# Patient Record
Sex: Male | Born: 1965 | Race: Black or African American | Hispanic: No | Marital: Married | Smoking: Never smoker
Health system: Southern US, Community
[De-identification: ages and names within clinical notes are randomized; demographics above are authoritative.]

## PROBLEM LIST (undated history)

## (undated) DIAGNOSIS — I10 Essential (primary) hypertension: Secondary | ICD-10-CM

## (undated) DIAGNOSIS — E785 Hyperlipidemia, unspecified: Secondary | ICD-10-CM

## (undated) HISTORY — PX: HAND SURGERY: SHX662

## (undated) HISTORY — PX: FOOT SURGERY: SHX648

## (undated) HISTORY — DX: Essential (primary) hypertension: I10

## (undated) HISTORY — DX: Hyperlipidemia, unspecified: E78.5

## (undated) HISTORY — PX: TRICEPS TENDON REPAIR: SHX2577

## (undated) HISTORY — PX: NOSE SURGERY: SHX723

---

## 1997-08-11 ENCOUNTER — Encounter: Admission: RE | Admit: 1997-08-11 | Discharge: 1997-08-11 | Payer: Self-pay | Admitting: *Deleted

## 2001-07-01 ENCOUNTER — Ambulatory Visit (HOSPITAL_COMMUNITY): Admission: RE | Admit: 2001-07-01 | Discharge: 2001-07-01 | Payer: Self-pay | Admitting: *Deleted

## 2001-08-07 ENCOUNTER — Encounter: Payer: Self-pay | Admitting: Emergency Medicine

## 2001-08-07 ENCOUNTER — Emergency Department (HOSPITAL_COMMUNITY): Admission: EM | Admit: 2001-08-07 | Discharge: 2001-08-07 | Payer: Self-pay | Admitting: Emergency Medicine

## 2003-11-30 ENCOUNTER — Ambulatory Visit (HOSPITAL_BASED_OUTPATIENT_CLINIC_OR_DEPARTMENT_OTHER): Admission: RE | Admit: 2003-11-30 | Discharge: 2003-11-30 | Payer: Self-pay | Admitting: General Surgery

## 2003-11-30 ENCOUNTER — Ambulatory Visit (HOSPITAL_COMMUNITY): Admission: RE | Admit: 2003-11-30 | Discharge: 2003-11-30 | Payer: Self-pay | Admitting: General Surgery

## 2010-05-08 ENCOUNTER — Other Ambulatory Visit: Payer: Self-pay | Admitting: Family Medicine

## 2010-05-08 DIAGNOSIS — Z0000 Encounter for general adult medical examination without abnormal findings: Secondary | ICD-10-CM

## 2010-05-09 ENCOUNTER — Ambulatory Visit
Admission: RE | Admit: 2010-05-09 | Discharge: 2010-05-09 | Disposition: A | Discharge status: Home / Self Care | Payer: 59 | Source: Ambulatory Visit | Attending: Family Medicine | Admitting: Family Medicine

## 2010-05-09 DIAGNOSIS — Z0000 Encounter for general adult medical examination without abnormal findings: Secondary | ICD-10-CM

## 2011-11-13 ENCOUNTER — Other Ambulatory Visit: Payer: Self-pay | Admitting: Family Medicine

## 2011-11-13 DIAGNOSIS — R109 Unspecified abdominal pain: Secondary | ICD-10-CM

## 2011-11-15 ENCOUNTER — Ambulatory Visit
Admission: RE | Admit: 2011-11-15 | Discharge: 2011-11-15 | Disposition: A | Discharge status: Home / Self Care | Payer: 59 | Source: Ambulatory Visit | Attending: Family Medicine | Admitting: Family Medicine

## 2011-11-15 DIAGNOSIS — R109 Unspecified abdominal pain: Secondary | ICD-10-CM

## 2011-11-15 MED ORDER — IOHEXOL 300 MG/ML  SOLN
125.0000 mL | Freq: Once | INTRAMUSCULAR | Status: AC | PRN
  Administered 2011-11-15: 125 mL via INTRAVENOUS

## 2011-12-10 ENCOUNTER — Encounter (HOSPITAL_COMMUNITY): Payer: Self-pay | Admitting: *Deleted

## 2011-12-10 ENCOUNTER — Emergency Department (HOSPITAL_COMMUNITY)
Admission: EM | Admit: 2011-12-10 | Discharge: 2011-12-10 | Disposition: A | Discharge status: Home / Self Care | Payer: 59 | Attending: Emergency Medicine | Admitting: Emergency Medicine

## 2011-12-10 DIAGNOSIS — Z2089 Contact with and (suspected) exposure to other communicable diseases: Secondary | ICD-10-CM | POA: Insufficient documentation

## 2011-12-10 NOTE — ED Provider Notes (Signed)
History   This chart was scribed for Gabriel Skene, MD by Gerlean Ren, ED Scribe. This patient was seen in room TR02C/TR02C and the patient's care was started at 11:23 PM    CSN: [id number]  Arrival date & time 12/10/11  2214   First MD Initiated Contact with Patient 12/10/11 2323      Chief Complaint  Patient presents with  . Rash     The history is provided by the patient. No language interpreter was used.   Gabriel Barnett is a 46 y.o. male who presents to the Emergency Department worried about possible exposure to scabies.  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  Pt does not c/o itching or rash but wants to be sure he did not contract scabies.  Pt denies dyspnea or chest pain.  Pt has no h/o chronic medical conditions.  Pt denies tobacco and alcohol use.   History reviewed. No pertinent past medical history.  No past surgical history on file.  No family history on file.  History  Substance Use Topics  . Smoking status: Not on file  . Smokeless tobacco: Not on file  . Alcohol Use: Not on file      Review of Systems At least 10pt or greater review of systems completed and are negative except where specified in the HPI.  Allergies  Review of patient's allergies indicates no known allergies.  Home Medications  No current outpatient prescriptions on file.  BP 118/79  Pulse 96  Temp 98.2 F (36.8 C) (Oral)  Resp 16  SpO2 95%  Physical Exam  Nursing notes reviewed.  Electronic medical record reviewed. VITAL SIGNS:   Filed Vitals:   12/10/11 2225  BP: 118/79  Pulse: 96  Temp: 98.2 F (36.8 C)  TempSrc: Oral  Resp: 16  SpO2: 95%   CONSTITUTIONAL: Awake, oriented, appears non-toxic, heavily muscled HENT: Atraumatic, normocephalic, oral mucosa pink and moist, airway patent. Nares patent without drainage. External ears normal. EYES: Conjunctiva clear, EOMI,  PERRLA NECK: Trachea midline, non-tender, supple CARDIOVASCULAR: Normal heart rate, Normal rhythm, No murmurs, rubs, gallops PULMONARY/CHEST: Clear to auscultation, no rhonchi, wheezes, or rales. Symmetrical breath sounds. Non-tender. ABDOMINAL: Non-distended, soft, non-tender - no rebound or guarding.  BS normal. NEUROLOGIC: Non-focal, moving all four extremities, no gross sensory or motor deficits. EXTREMITIES: No clubbing, cyanosis, or edema SKIN: Warm, Dry, No erythema, No rash - no burrows  ED Course  Procedures (including critical care time) DIAGNOSTIC STUDIES: Oxygen Saturation is 95% on room air, adequate by my interpretation.    COORDINATION OF CARE: 11:30 PM- Patient informed of clinical course, understands medical decision-making process, and agrees with plan.  Labs Reviewed - No data to display No results found.   1. Exposure to scabies       MDM  [name] is a 46 y.o. male with possible exposure to scabies.  No symptoms or physical findings.  Pt given Rx for 26mcg/kg ivermectin single dose in case symptoms do appear. Will f/u with PCP/Urgent care if medication is taken or symtpoms appear.   I personally performed the services described in this documentation, which was scribed in my presence. The recorded information has been reviewed and is accurate. Gabriel Barnett, M.D.          Gabriel Skene, MD 12/11/11 [phone]

## 2011-12-10 NOTE — ED Notes (Signed)
Pt reports outbreak of scabies at work.  Denies itching, wants to be sure he doesn't have it.

## 2011-12-11 MED ORDER — IVERMECTIN 3 MG PO TABS: 25.0000 mg | ORAL_TABLET | Freq: Once | ORAL | Status: DC

## 2011-12-13 ENCOUNTER — Other Ambulatory Visit (HOSPITAL_COMMUNITY): Payer: Self-pay | Admitting: Urology

## 2011-12-13 DIAGNOSIS — N2889 Other specified disorders of kidney and ureter: Secondary | ICD-10-CM

## 2011-12-18 ENCOUNTER — Other Ambulatory Visit (HOSPITAL_COMMUNITY): Payer: Self-pay | Admitting: Urology

## 2011-12-18 DIAGNOSIS — D49519 Neoplasm of unspecified behavior of unspecified kidney: Secondary | ICD-10-CM

## 2011-12-26 ENCOUNTER — Ambulatory Visit (HOSPITAL_COMMUNITY): Payer: 59

## 2011-12-27 ENCOUNTER — Ambulatory Visit (HOSPITAL_COMMUNITY)
Admission: RE | Admit: 2011-12-27 | Discharge: 2011-12-27 | Disposition: A | Discharge status: Home / Self Care | Payer: 59 | Source: Ambulatory Visit | Attending: Urology | Admitting: Urology

## 2011-12-27 DIAGNOSIS — N289 Disorder of kidney and ureter, unspecified: Secondary | ICD-10-CM | POA: Insufficient documentation

## 2011-12-27 DIAGNOSIS — R109 Unspecified abdominal pain: Secondary | ICD-10-CM | POA: Insufficient documentation

## 2011-12-27 DIAGNOSIS — D49519 Neoplasm of unspecified behavior of unspecified kidney: Secondary | ICD-10-CM

## 2011-12-27 DIAGNOSIS — N390 Urinary tract infection, site not specified: Secondary | ICD-10-CM | POA: Insufficient documentation

## 2011-12-27 DIAGNOSIS — N281 Cyst of kidney, acquired: Secondary | ICD-10-CM | POA: Insufficient documentation

## 2011-12-27 LAB — CREATININE, SERUM
Creatinine, Ser: 1.4 mg/dL — ABNORMAL HIGH (ref 0.50–1.35)
GFR calc Af Amer: 68 mL/min — ABNORMAL LOW (ref >90)
GFR calc non Af Amer: 58 mL/min — ABNORMAL LOW (ref >90)

## 2011-12-27 MED ORDER — GADOBENATE DIMEGLUMINE 529 MG/ML IV SOLN
20.0000 mL | Freq: Once | INTRAVENOUS | Status: AC | PRN
  Administered 2011-12-27: 20 mL via INTRAVENOUS

## 2016-01-16 DIAGNOSIS — R739 Hyperglycemia, unspecified: Secondary | ICD-10-CM | POA: Diagnosis not present

## 2016-01-16 DIAGNOSIS — M10042 Idiopathic gout, left hand: Secondary | ICD-10-CM | POA: Diagnosis not present

## 2016-01-16 DIAGNOSIS — I10 Essential (primary) hypertension: Secondary | ICD-10-CM | POA: Diagnosis not present

## 2016-01-16 DIAGNOSIS — E7800 Pure hypercholesterolemia, unspecified: Secondary | ICD-10-CM | POA: Diagnosis not present

## 2016-01-16 DIAGNOSIS — M7989 Other specified soft tissue disorders: Secondary | ICD-10-CM | POA: Diagnosis not present

## 2016-02-20 DIAGNOSIS — J209 Acute bronchitis, unspecified: Secondary | ICD-10-CM | POA: Diagnosis not present

## 2016-02-20 DIAGNOSIS — R5383 Other fatigue: Secondary | ICD-10-CM | POA: Diagnosis not present

## 2016-02-20 DIAGNOSIS — K210 Gastro-esophageal reflux disease with esophagitis: Secondary | ICD-10-CM | POA: Diagnosis not present

## 2016-05-01 DIAGNOSIS — I10 Essential (primary) hypertension: Secondary | ICD-10-CM | POA: Diagnosis not present

## 2016-05-01 DIAGNOSIS — E7800 Pure hypercholesterolemia, unspecified: Secondary | ICD-10-CM | POA: Diagnosis not present

## 2016-05-01 DIAGNOSIS — R739 Hyperglycemia, unspecified: Secondary | ICD-10-CM | POA: Diagnosis not present

## 2016-05-18 DIAGNOSIS — I10 Essential (primary) hypertension: Secondary | ICD-10-CM | POA: Diagnosis not present

## 2016-05-18 DIAGNOSIS — R7989 Other specified abnormal findings of blood chemistry: Secondary | ICD-10-CM | POA: Diagnosis not present

## 2016-05-18 DIAGNOSIS — K210 Gastro-esophageal reflux disease with esophagitis: Secondary | ICD-10-CM | POA: Diagnosis not present

## 2016-05-18 DIAGNOSIS — E7800 Pure hypercholesterolemia, unspecified: Secondary | ICD-10-CM | POA: Diagnosis not present

## 2016-05-18 DIAGNOSIS — R748 Abnormal levels of other serum enzymes: Secondary | ICD-10-CM | POA: Diagnosis not present

## 2016-06-07 DIAGNOSIS — K625 Hemorrhage of anus and rectum: Secondary | ICD-10-CM | POA: Diagnosis not present

## 2016-09-04 DIAGNOSIS — R7303 Prediabetes: Secondary | ICD-10-CM | POA: Diagnosis not present

## 2016-09-04 DIAGNOSIS — E7800 Pure hypercholesterolemia, unspecified: Secondary | ICD-10-CM | POA: Diagnosis not present

## 2016-09-04 DIAGNOSIS — I10 Essential (primary) hypertension: Secondary | ICD-10-CM | POA: Diagnosis not present

## 2016-09-17 DIAGNOSIS — R7303 Prediabetes: Secondary | ICD-10-CM | POA: Diagnosis not present

## 2016-09-17 DIAGNOSIS — R748 Abnormal levels of other serum enzymes: Secondary | ICD-10-CM | POA: Diagnosis not present

## 2016-09-17 DIAGNOSIS — R7989 Other specified abnormal findings of blood chemistry: Secondary | ICD-10-CM | POA: Diagnosis not present

## 2016-09-17 DIAGNOSIS — I10 Essential (primary) hypertension: Secondary | ICD-10-CM | POA: Diagnosis not present

## 2016-10-23 DIAGNOSIS — I129 Hypertensive chronic kidney disease with stage 1 through stage 4 chronic kidney disease, or unspecified chronic kidney disease: Secondary | ICD-10-CM | POA: Diagnosis not present

## 2016-10-23 DIAGNOSIS — R799 Abnormal finding of blood chemistry, unspecified: Secondary | ICD-10-CM | POA: Diagnosis not present

## 2017-01-03 DIAGNOSIS — R7303 Prediabetes: Secondary | ICD-10-CM | POA: Diagnosis not present

## 2017-01-03 DIAGNOSIS — R748 Abnormal levels of other serum enzymes: Secondary | ICD-10-CM | POA: Diagnosis not present

## 2017-01-03 DIAGNOSIS — I10 Essential (primary) hypertension: Secondary | ICD-10-CM | POA: Diagnosis not present

## 2017-01-22 DIAGNOSIS — I129 Hypertensive chronic kidney disease with stage 1 through stage 4 chronic kidney disease, or unspecified chronic kidney disease: Secondary | ICD-10-CM | POA: Diagnosis not present

## 2017-02-13 DIAGNOSIS — L700 Acne vulgaris: Secondary | ICD-10-CM | POA: Diagnosis not present

## 2017-06-24 DIAGNOSIS — R7303 Prediabetes: Secondary | ICD-10-CM | POA: Diagnosis not present

## 2017-06-24 DIAGNOSIS — I10 Essential (primary) hypertension: Secondary | ICD-10-CM | POA: Diagnosis not present

## 2017-06-24 DIAGNOSIS — R748 Abnormal levels of other serum enzymes: Secondary | ICD-10-CM | POA: Diagnosis not present

## 2017-06-28 DIAGNOSIS — R7303 Prediabetes: Secondary | ICD-10-CM | POA: Diagnosis not present

## 2017-06-28 DIAGNOSIS — R7989 Other specified abnormal findings of blood chemistry: Secondary | ICD-10-CM | POA: Diagnosis not present

## 2017-06-28 DIAGNOSIS — I10 Essential (primary) hypertension: Secondary | ICD-10-CM | POA: Diagnosis not present

## 2017-06-28 DIAGNOSIS — E7800 Pure hypercholesterolemia, unspecified: Secondary | ICD-10-CM | POA: Diagnosis not present

## 2017-08-28 DIAGNOSIS — S46312A Strain of muscle, fascia and tendon of triceps, left arm, initial encounter: Secondary | ICD-10-CM | POA: Diagnosis not present

## 2017-10-23 DIAGNOSIS — R799 Abnormal finding of blood chemistry, unspecified: Secondary | ICD-10-CM | POA: Diagnosis not present

## 2017-10-23 DIAGNOSIS — I129 Hypertensive chronic kidney disease with stage 1 through stage 4 chronic kidney disease, or unspecified chronic kidney disease: Secondary | ICD-10-CM | POA: Diagnosis not present

## 2017-10-23 DIAGNOSIS — Z7952 Long term (current) use of systemic steroids: Secondary | ICD-10-CM | POA: Diagnosis not present

## 2017-11-26 DIAGNOSIS — R7303 Prediabetes: Secondary | ICD-10-CM | POA: Diagnosis not present

## 2017-11-26 DIAGNOSIS — E7800 Pure hypercholesterolemia, unspecified: Secondary | ICD-10-CM | POA: Diagnosis not present

## 2017-11-26 DIAGNOSIS — R079 Chest pain, unspecified: Secondary | ICD-10-CM | POA: Diagnosis not present

## 2017-11-26 DIAGNOSIS — I10 Essential (primary) hypertension: Secondary | ICD-10-CM | POA: Diagnosis not present

## 2018-02-14 DIAGNOSIS — M26609 Unspecified temporomandibular joint disorder, unspecified side: Secondary | ICD-10-CM | POA: Diagnosis not present

## 2018-02-14 DIAGNOSIS — G5602 Carpal tunnel syndrome, left upper limb: Secondary | ICD-10-CM | POA: Diagnosis not present

## 2018-03-17 DIAGNOSIS — Z125 Encounter for screening for malignant neoplasm of prostate: Secondary | ICD-10-CM | POA: Diagnosis not present

## 2018-03-17 DIAGNOSIS — E7800 Pure hypercholesterolemia, unspecified: Secondary | ICD-10-CM | POA: Diagnosis not present

## 2018-03-17 DIAGNOSIS — R739 Hyperglycemia, unspecified: Secondary | ICD-10-CM | POA: Diagnosis not present

## 2018-03-17 DIAGNOSIS — I10 Essential (primary) hypertension: Secondary | ICD-10-CM | POA: Diagnosis not present

## 2018-03-17 DIAGNOSIS — R7989 Other specified abnormal findings of blood chemistry: Secondary | ICD-10-CM | POA: Diagnosis not present

## 2018-05-02 ENCOUNTER — Other Ambulatory Visit: Payer: Self-pay | Admitting: Family Medicine

## 2018-05-02 ENCOUNTER — Other Ambulatory Visit: Payer: Self-pay

## 2018-05-02 ENCOUNTER — Ambulatory Visit
Admission: RE | Admit: 2018-05-02 | Discharge: 2018-05-02 | Disposition: A | Discharge status: Home / Self Care | Payer: 59 | Source: Ambulatory Visit | Attending: Family Medicine | Admitting: Family Medicine

## 2018-05-02 DIAGNOSIS — M545 Low back pain: Secondary | ICD-10-CM | POA: Diagnosis not present

## 2018-05-02 DIAGNOSIS — M5416 Radiculopathy, lumbar region: Secondary | ICD-10-CM

## 2018-05-15 ENCOUNTER — Ambulatory Visit: Payer: 59 | Admitting: Dietician

## 2018-05-29 ENCOUNTER — Ambulatory Visit: Payer: 59 | Admitting: Registered"

## 2018-06-11 ENCOUNTER — Ambulatory Visit: Payer: 59 | Admitting: Dietician

## 2018-06-26 ENCOUNTER — Encounter: Payer: 59 | Attending: Family Medicine | Admitting: Dietician

## 2018-06-26 ENCOUNTER — Encounter: Payer: Self-pay | Admitting: Dietician

## 2018-06-26 ENCOUNTER — Other Ambulatory Visit: Payer: Self-pay

## 2018-06-26 DIAGNOSIS — R7303 Prediabetes: Secondary | ICD-10-CM | POA: Diagnosis not present

## 2018-06-26 NOTE — Progress Notes (Signed)
Medical Nutrition Therapy  Appt Start Time: 7:45am End Time: 9:00am  Primary concerns today: weight maintenance and proper amounts of food to eat based on age and activity level, certain foods/products/supplements that are appropriate, plant-based eating, cholesterol and blood sugar management   Referral diagnosis: Z78.9 Vegetarian Diet and R73.03 Prediabetes Preferred learning style: no preference indicated Learning readiness: ready   NUTRITION ASSESSMENT   Anthropometrics  Weight: 263.2lbs  Height: 73.5 in  BMI: 34.2  Body Composition Scale 06/26/2018  Total Body Fat % 30.2  Visceral Fat 21  Fat-Free Mass % 69.7   Total Body Water % 50.7   Muscle-Mass lbs 49.6  Body Fat Displacement          Torso  lbs 49.3         Left Leg  lbs 9.8         Right Leg  lbs 9.8         Left Arm  lbs 4.9         Right Arm   lbs 4.9   Basal Metabolic Rate: 2992 kcals  Min (Light Activity): 2502 kcals    Max (Very Active): 3962 kcals   Biochemical Data & Labs HgbA1c: 6.2% (H) Creatinine: 1.43 mg/dL (H)  Cholesterol: 203 mg/dL (H)  HDL: 46 mg/dL  TG: 92 mg/dL  LDL: 138 mg/dL (H)  Non-HDL: 156 mg/dL (H)   Clinical Medical Hx: hypercholesterolemia, HTN  Surgeries: nose, tricep, hand (x2), foot/ankle  Medications: none Allergies: NKA  Psychosocial/Lifestyle Pt is very talkative. 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 Has a wife, children, and grandchildren. Pt is very personable and friendly, was engaged during the appointment.   24-Hr Dietary Recall First Meal: egg whites (24g protein) + 1 to 2 packets grits (or protein pancakes: pancake mix + protein powder + banana + cinnamon + skim milk + water)  Snack: eggs + spinach (or [place]) + salsa Second Meal: spinach + chicken + rice  Snack: boiled eggs + tater tots (or hamburger patty + fries)  Third Meal: lower  calorie protein shake  Snack: egg whites + grits  Beverages: water, sparkling water   Food & Nutrition Related Hx Dietary Hx: Pt states he eats about every 3 hours. Sometimes will eat cafeteria food at the prison. Likes to make protein pancakes and have protein shakes. Likes sauces: ketchup, ranch, BBQ. Generally avoids pork (sausage, bacon) and red meat. States he tries to cut back on breads and rice or will have those things in between meals, but tries to focus on meat/protein and vegetables at mealtimes.  States his doctor would like him to follow a vegetarian diet, and that he is open to more plant-based options but not particularly interested in eating vegetarian diet. States he is "sensitive" to fiber so avoids large amounts of beans. Will have an energy drink every once in a while (every other week maybe.) Likes cinnamon and tries to add it to foods and takes cinnamon pills.  Estimated Daily Fluid Intake: 1+ gallon Supplements: L-arginine, protein, testosterone boosters, vitamin C, vitamin D, B12, milk thistle, saw palmetto, CoQ-10    GI / Other Notable Symptoms: none stated   Physical Activity  Current average weekly physical activity: 4-5 days/week of weight lifting, been adding in more cardio lately (~10 mins/gym session)   Estimated Energy Needs Calories: 3500 Carbohydrate: 395g Protein: 220g Fat:  115g   NUTRITION DIAGNOSIS  Altered nutrition-related laboratory values ([place]-2.2) related to prediabetes and high cholesterol as evidenced by elevated HgbA1c (6.2%), cholesterol (203mg /dL), and LDL cholesterol (138mg /dL.)   NUTRITION INTERVENTION  Nutrition education (E-1) on the following topics:  . Plant-based eating (including plant protein sources and products)  . General healthful eating  . Types of sugar  Education was limited partially because pt arrived late to appointment. Additionally, pt is quite talkative which made it difficult to provide education and guide the  appointment. Pt seems to understand very well basic nutrition and what is appropriate for his amount and type of physical activity, but asked very good questions about specific things. Hope to educate more in the future on specific dietary changes appropriate for cholesterol management.   Handouts Provided Include   Types of Sugar & Effects on BG   Plant Based  Learning Style & Readiness for Change Teaching method utilized: Visual & Auditory  Demonstrated degree of understanding via: Teach Back  Barriers to learning/adherence to lifestyle change: None Identified   Goals Established by Pt . Include more plant based foods and products in diet.  Marland Kitchen Avoid added sugars (<10 grams/serving.)    MONITORING & EVALUATION Dietary intake, weekly physical activity, and goals prn.  RD's Notes for Next Visit  . Cholesterol . Balanced Meal Ideas  Next Steps  Patient is to contact [facility] to schedule follow up visit as needed/desired or as recommended per MD.

## 2018-06-26 NOTE — Patient Instructions (Addendum)
   Start including more plant-based foods and products into your diet throughout the day.   Remember to spread your carbohydrate intake throughout the day. Best carbohydrate foods are whole foods (fruits, vegetables, and whole grains.) Always try to eat protein foods with carbohydrate foods.   Keep added sugars low (try to do 10 grams or less per serving). Stay away from syrup, sodas, desserts, and even large amounts of fruit juice.   Keep eating every 3-4 hours and drinking plenty of fluids, especially water.

## 2020-04-19 ENCOUNTER — Other Ambulatory Visit: Payer: Self-pay | Admitting: Family Medicine

## 2020-04-19 DIAGNOSIS — R222 Localized swelling, mass and lump, trunk: Secondary | ICD-10-CM

## 2020-05-04 ENCOUNTER — Ambulatory Visit
Admission: RE | Admit: 2020-05-04 | Discharge: 2020-05-04 | Disposition: A | Discharge status: Home / Self Care | Payer: 59 | Source: Ambulatory Visit | Attending: Family Medicine | Admitting: Family Medicine

## 2020-05-04 DIAGNOSIS — R222 Localized swelling, mass and lump, trunk: Secondary | ICD-10-CM

## 2020-05-07 ENCOUNTER — Other Ambulatory Visit: Payer: Self-pay

## 2020-05-07 ENCOUNTER — Ambulatory Visit (HOSPITAL_COMMUNITY)
Admission: EM | Admit: 2020-05-07 | Discharge: 2020-05-07 | Disposition: A | Discharge status: Home / Self Care | Payer: 59 | Attending: Medical Oncology | Admitting: Medical Oncology

## 2020-05-07 ENCOUNTER — Encounter (HOSPITAL_COMMUNITY): Payer: Self-pay | Admitting: *Deleted

## 2020-05-07 DIAGNOSIS — K219 Gastro-esophageal reflux disease without esophagitis: Secondary | ICD-10-CM | POA: Diagnosis not present

## 2020-05-07 MED ORDER — OMEPRAZOLE 20 MG PO CPDR: 20.0000 mg | DELAYED_RELEASE_CAPSULE | Freq: Every day | ORAL | 0 refills | Status: DC

## 2020-05-07 NOTE — ED Provider Notes (Signed)
[facility]    CSN: [id number] Arrival date & time: 05/07/20  1442      History   Chief Complaint Chief Complaint  Patient presents with  . Gastroesophageal Reflux    HPI Gabriel Barnett is a 55 y.o. male.   HPI   GERD: Pt reports a history of GERD that is recently worsened after he started drinking a beet juice. HE reports that when he eats he feels as if food will get stuck going down. No choking episodes. No vomiting, chest pain or SOB. He thinks it is possible that he has a hiatal hernia but is unsure. He has tried tums for symptoms which has helped some.     Past Medical History:  Diagnosis Date  . Hyperlipidemia   . Hypertension     There are no problems to display for this patient.   Past Surgical History:  Procedure Laterality Date  . FOOT SURGERY Right    foot and ankle  . HAND SURGERY Right   . NOSE SURGERY    . TRICEPS TENDON REPAIR         Home Medications    Prior to Admission medications   Medication Sig Start Date End Date Taking? Authorizing Provider  omeprazole (PRILOSEC) 20 MG capsule Take 1 capsule (20 mg total) by mouth daily. 05/07/20  Yes Anamarie Hunn M, PA-C  ivermectin (STROMECTOL) 3 MG TABS Take 8.5 tablets (25.5 mg total) by mouth once. ONLY take if you develop symptoms of scabies with itchy rash. YOU MUST have doctors follow up for another dose. 12/11/11   Bonk, Harrington Challenger, MD  PRESCRIPTION MEDICATION Take 1 tablet by mouth daily. hctz    [provider]    Family History Family History  Problem Relation Age of Onset  . Diabetes type II Other     Social History Social History   Tobacco Use  . Smoking status: Never Smoker  . Smokeless tobacco: Never Used     Allergies   Patient has no known allergies.   Review of Systems Review of Systems  As stated above in HPI Physical Exam Triage Vital Signs ED Triage Vitals [05/07/20 1515]  Enc Vitals Group     BP 127/78     Pulse Rate 84     Resp 18      Temp 98.4 F (36.9 C)     Temp Source Oral     SpO2 94 %     Weight      Height      Head Circumference      Peak Flow      Pain Score      Pain Loc      Pain Edu?      Excl. in [place]?    No data found.  Updated Vital Signs BP 127/78 (BP Location: Right Arm)   Pulse 84   Temp 98.4 F (36.9 C) (Oral)   Resp 18   SpO2 94%   Physical Exam Vitals and nursing note reviewed.  Constitutional:      Appearance: Normal appearance.  HENT:     Head: Normocephalic and atraumatic.  Cardiovascular:     Rate and Rhythm: Normal rate and regular rhythm.     Heart sounds: Normal heart sounds.     Comments: NO chest tenderness Pulmonary:     Effort: Pulmonary effort is normal.     Breath sounds: Normal breath sounds.  Abdominal:     General: Abdomen is flat. Bowel sounds are  normal.     Palpations: Abdomen is soft.  Neurological:     Mental Status: He is alert.      UC Treatments / Results  Labs (all labs ordered are listed, but only abnormal results are displayed) Labs Reviewed - No data to display  EKG   Radiology No results found.  Procedures Procedures (including critical care time)  Medications Ordered in UC Medications - No data to display  Initial Impression / Assessment and Plan / UC Course  I have reviewed the triage vital signs and the nursing notes.  Pertinent labs & imaging results that were available during my care of the patient were reviewed by me and considered in my medical decision making (see chart for details).     New.  Discussed GERD and potential hiatal hernia.  He will follow-up with his PCP next week.  In the meantime we will start him on a GERD diet, have him stop the beet juice and start omeprazole.  Discussed red flag signs and symptoms.  Soft foods and small bites for now. Final Clinical Impressions(s) / UC Diagnoses   Final diagnoses:  Gastroesophageal reflux disease, unspecified whether esophagitis present   Discharge  Instructions   None    ED Prescriptions    Medication Sig Dispense Auth. Provider   omeprazole (PRILOSEC) 20 MG capsule Take 1 capsule (20 mg total) by mouth daily. 30 capsule Hughie Closs, [place]     PDMP not reviewed this encounter.   Hughie Closs, [place] 05/07/20 1542

## 2020-05-07 NOTE — ED Triage Notes (Signed)
Pt reports a feeling that food is stuck in his throat down to Chest and sever acid reflux.

## 2020-05-17 ENCOUNTER — Other Ambulatory Visit: Payer: Self-pay | Admitting: Family Medicine

## 2020-05-18 ENCOUNTER — Other Ambulatory Visit: Payer: Self-pay | Admitting: Family Medicine

## 2020-05-18 DIAGNOSIS — R9389 Abnormal findings on diagnostic imaging of other specified body structures: Secondary | ICD-10-CM

## 2020-06-04 ENCOUNTER — Other Ambulatory Visit: Payer: Self-pay

## 2020-06-07 ENCOUNTER — Other Ambulatory Visit: Payer: Self-pay

## 2020-06-07 ENCOUNTER — Ambulatory Visit
Admission: RE | Admit: 2020-06-07 | Discharge: 2020-06-07 | Disposition: A | Discharge status: Home / Self Care | Payer: 59 | Source: Ambulatory Visit | Attending: Family Medicine | Admitting: Family Medicine

## 2020-06-07 DIAGNOSIS — R9389 Abnormal findings on diagnostic imaging of other specified body structures: Secondary | ICD-10-CM

## 2020-07-19 ENCOUNTER — Encounter (HOSPITAL_COMMUNITY): Payer: Self-pay

## 2020-07-19 ENCOUNTER — Other Ambulatory Visit: Payer: Self-pay

## 2020-07-19 ENCOUNTER — Emergency Department (HOSPITAL_COMMUNITY): Payer: No Typology Code available for payment source

## 2020-07-19 ENCOUNTER — Emergency Department (HOSPITAL_COMMUNITY)
Admission: EM | Admit: 2020-07-19 | Discharge: 2020-07-20 | Disposition: A | Discharge status: Home / Self Care | Payer: No Typology Code available for payment source | Attending: Emergency Medicine | Admitting: Emergency Medicine

## 2020-07-19 DIAGNOSIS — S6991XA Unspecified injury of right wrist, hand and finger(s), initial encounter: Secondary | ICD-10-CM | POA: Diagnosis not present

## 2020-07-19 DIAGNOSIS — Y990 Civilian activity done for income or pay: Secondary | ICD-10-CM | POA: Insufficient documentation

## 2020-07-19 DIAGNOSIS — I10 Essential (primary) hypertension: Secondary | ICD-10-CM | POA: Insufficient documentation

## 2020-07-19 DIAGNOSIS — M79644 Pain in right finger(s): Secondary | ICD-10-CM | POA: Diagnosis not present

## 2020-07-19 DIAGNOSIS — M20011 Mallet finger of right finger(s): Secondary | ICD-10-CM
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
## 2021-02-17 ENCOUNTER — Ambulatory Visit: Payer: Self-pay

## 2021-02-17 ENCOUNTER — Other Ambulatory Visit: Payer: Self-pay

## 2021-02-17 ENCOUNTER — Other Ambulatory Visit: Payer: Self-pay | Admitting: Family Medicine

## 2021-02-17 DIAGNOSIS — M79644 Pain in right finger(s): Secondary | ICD-10-CM

## 2021-02-17 DIAGNOSIS — M79645 Pain in left finger(s): Secondary | ICD-10-CM

## 2022-01-07 ENCOUNTER — Other Ambulatory Visit: Payer: Self-pay

## 2022-01-07 ENCOUNTER — Emergency Department (HOSPITAL_BASED_OUTPATIENT_CLINIC_OR_DEPARTMENT_OTHER)
Admission: EM | Admit: 2022-01-07 | Discharge: 2022-01-07 | Disposition: A | Discharge status: Home / Self Care | Payer: 59 | Attending: Emergency Medicine | Admitting: Emergency Medicine

## 2022-01-07 ENCOUNTER — Encounter (HOSPITAL_BASED_OUTPATIENT_CLINIC_OR_DEPARTMENT_OTHER): Payer: Self-pay | Admitting: Emergency Medicine

## 2022-01-07 DIAGNOSIS — R590 Localized enlarged lymph nodes: Secondary | ICD-10-CM

## 2022-01-07 DIAGNOSIS — J020 Streptococcal pharyngitis: Secondary | ICD-10-CM | POA: Insufficient documentation

## 2022-01-07 DIAGNOSIS — Z20822 Contact with and (suspected) exposure to covid-19: Secondary | ICD-10-CM | POA: Insufficient documentation

## 2022-01-07 DIAGNOSIS — I10 Essential (primary) hypertension: Secondary | ICD-10-CM | POA: Insufficient documentation

## 2022-01-07 DIAGNOSIS — M542 Cervicalgia: Secondary | ICD-10-CM | POA: Insufficient documentation

## 2022-01-07 LAB — RESP PANEL BY RT-PCR (RSV, FLU A&B, COVID)  RVPGX2
Influenza A by PCR: NEGATIVE
Influenza B by PCR: NEGATIVE
Resp Syncytial Virus by PCR: NEGATIVE
SARS Coronavirus 2 by RT PCR: NEGATIVE

## 2022-01-07 LAB — GROUP A STREP BY PCR: Group A Strep by PCR: DETECTED — AB

## 2022-01-07 MED ORDER — DEXAMETHASONE 4 MG PO TABS
10.0000 mg | ORAL_TABLET | Freq: Once | ORAL | Status: AC
  Administered 2022-01-07: 10 mg via ORAL
  Filled 2022-01-07: qty 3

## 2022-01-07 MED ORDER — AMOXICILLIN 500 MG PO CAPS: 1000.0000 mg | ORAL_CAPSULE | Freq: Every day | ORAL | 0 refills | Status: AC

## 2022-01-07 MED ORDER — LIDOCAINE VISCOUS HCL 2 % MT SOLN: 15.0000 mL | OROMUCOSAL | 0 refills | Status: DC | PRN

## 2022-01-07 MED ORDER — ACETAMINOPHEN 325 MG PO TABS
650.0000 mg | ORAL_TABLET | Freq: Once | ORAL | Status: AC
  Administered 2022-01-07: 650 mg via ORAL
  Filled 2022-01-07: qty 2

## 2022-01-07 MED ORDER — AMOXICILLIN 500 MG PO CAPS
1000.0000 mg | ORAL_CAPSULE | Freq: Once | ORAL | Status: AC
  Administered 2022-01-07: 1000 mg via ORAL
  Filled 2022-01-07: qty 2

## 2022-01-07 MED ORDER — IBUPROFEN 400 MG PO TABS
600.0000 mg | ORAL_TABLET | Freq: Once | ORAL | Status: AC
  Administered 2022-01-07: 600 mg via ORAL
  Filled 2022-01-07: qty 1

## 2022-01-07 NOTE — ED Provider Notes (Signed)
[name] Provider Note   CSN: [id number] Arrival date & time: 01/07/22  2536     History  Chief Complaint  Patient presents with   Sore Throat    Gabriel Barnett is a 56 y.o. male.  Patient is a 56 year old male with a past medical history of hypertension presenting to the emergency department with throat and neck pain.  Patient states that about a week ago he had cough and congestion with some fevers that initially improved and on Friday he started to develop left-sided neck pain and sore throat.  He states that his fevers returned.  He denies any cough, nausea, vomiting or diarrhea.  He states that he is having some pain in his left ear denies any hearing changes.  The history is provided by the patient and the spouse.  Sore Throat       Home Medications Prior to Admission medications   Medication Sig Start Date End Date Taking? Authorizing Provider  amoxicillin (AMOXIL) 500 MG capsule Take 2 capsules (1,000 mg total) by mouth daily for 10 days. 01/07/22 01/17/22 Yes Kingsley, Jordan Hawks K, DO  lidocaine (XYLOCAINE) 2 % solution Use as directed 15 mLs in the mouth or throat as needed for mouth pain. 01/07/22  Yes Maylon Peppers, [place] K, DO  ivermectin (STROMECTOL) 3 MG TABS Take 8.5 tablets (25.5 mg total) by mouth once. ONLY take if you develop symptoms of scabies with itchy rash. YOU MUST have doctors follow up for another dose. 12/11/11   Bonk, Harrington Challenger, MD  omeprazole (PRILOSEC) 20 MG capsule Take 1 capsule (20 mg total) by mouth daily. 05/07/20   Hughie Closs, PA-C  PRESCRIPTION MEDICATION Take 1 tablet by mouth daily. hctz    [provider]      Allergies    Patient has no known allergies.    Review of Systems   Review of Systems  Physical Exam Updated Vital Signs BP (!) 142/77   Pulse (!) 103 Comment: Simultaneous filing. User may not have seen previous data.  Temp (!) 101.9 F (38.8 C) (Oral)   SpO2 95%  Physical  Exam Vitals and nursing note reviewed.  Constitutional:      General: He is not in acute distress.    Appearance: He is well-developed.  HENT:     Head: Normocephalic and atraumatic.     Right Ear: Tympanic membrane and ear canal normal.     Left Ear: Tympanic membrane and ear canal normal.     Mouth/Throat:     Mouth: Mucous membranes are moist.     Pharynx: Uvula midline. Posterior oropharyngeal erythema present. No pharyngeal swelling.     Tonsils: No tonsillar exudate or tonsillar abscesses. 1+ on the right. 1+ on the left.     Comments: No trismus, tolerating secretions, normal phonation Eyes:     Conjunctiva/sclera: Conjunctivae normal.     Pupils: Pupils are equal, round, and reactive to light.  Cardiovascular:     Rate and Rhythm: Normal rate and regular rhythm.     Heart sounds: Normal heart sounds.  Pulmonary:     Effort: Pulmonary effort is normal.     Breath sounds: Normal breath sounds. No stridor.  Abdominal:     Palpations: Abdomen is soft.  Musculoskeletal:     Cervical back: Normal range of motion and neck supple.  Lymphadenopathy:     Cervical: Cervical adenopathy (Left-sided) present.  Skin:    General: Skin is warm and dry.  Neurological:  General: No focal deficit present.     Mental Status: He is alert and oriented to person, place, and time.  Psychiatric:        Mood and Affect: Mood normal.        Behavior: Behavior normal.     ED Results / Procedures / Treatments   Labs (all labs ordered are listed, but only abnormal results are displayed) Labs Reviewed  GROUP A STREP BY PCR - Abnormal; Notable for the following components:      Result Value   Group A Strep by PCR DETECTED (*)    All other components within normal limits  RESP PANEL BY RT-PCR (RSV, FLU A&B, COVID)  [id number]    EKG None  Radiology No results found.  Procedures Procedures    Medications Ordered in ED Medications  acetaminophen (TYLENOL) tablet 650 mg (has no  administration in time range)  ibuprofen (ADVIL) tablet 600 mg (has no administration in time range)  dexamethasone (DECADRON) tablet 10 mg (has no administration in time range)  amoxicillin (AMOXIL) capsule 1,000 mg (has no administration in time range)    ED Course/ Medical Decision Making/ A&P                           Medical Decision Making This patient presents to the ED with chief complaint(s) of throat pain with pertinent past medical history of HTN which further complicates the presenting complaint. The complaint involves an extensive differential diagnosis and also carries with it a high risk of complications and morbidity.    The differential diagnosis includes viral syndrome, strep, viral pharyngitis, lymphadenopathy, no evidence of otitis media or externa, no evidence of dental disease causing his pain, no evidence of PTA, RPA or Ludwick's on exam  Additional history obtained: Additional history obtained from spouse Records reviewed N/A  ED Course and Reassessment: Patient was initially evaluated by triage and had strep swab performed that was positive for strep.  He will additionally have COVID, flu and RSV performed.  He has no evidence of Ludwick's, RPA or PTA on exam with no trismus, normal phonation, tolerating secretions no submandibular swelling.  Left-sided neck pain is secondary to lymphadenopathy.  He was given Tylenol and Motrin for his fever and pain and will be given Decadron for his throat pain and swelling and amoxicillin for antibiotics.  He recommended primary care follow-up and was given strict return precautions.  Independent labs interpretation:  The following labs were independently interpreted: Strep positive  Independent visualization of imaging: N/A  Consultation: - Consulted or discussed management/test interpretation w/ external professional: N/A  Consideration for admission or further workup: Patient has no emergent conditions requiring admission  or further work-up at this time and is stable for discharge home with primary care follow-up  Social Determinants of health: N/A    Risk OTC drugs. Prescription drug management.          Final Clinical Impression(s) / ED Diagnoses Final diagnoses:  Strep throat  Anterior cervical lymphadenopathy    Rx / DC Orders ED Discharge Orders          Ordered    amoxicillin (AMOXIL) 500 MG capsule  Daily        01/07/22 0759    lidocaine (XYLOCAINE) 2 % solution  As needed        01/07/22 0759              Kemper Durie, DO 01/07/22 [phone]

## 2022-01-07 NOTE — Discharge Instructions (Signed)
You were seen in the emergency department for your throat and your neck pain.  You tested positive for strep throat and you have swollen lymph nodes on your neck which are common when you have an infection anywhere within your head and your neck.  You can take Tylenol or Advil as needed for fevers and pain.  I gave you a dose of steroids today that should help with the swelling and pain in your throat but also given you prescription for lidocaine that can numb your throat to help with additional pain as needed or you can try throat lozenges.  I have given you prescription of antibiotics and you should complete this as prescribed.  We also tested you for COVID and flu and you can follow-up these results online on your patient portal.  You should follow-up with your primary doctor in the next few days to have your symptoms rechecked.  You should return to the emergency department if you have significantly worsening pain, you are unable to open your mouth, you are unable to swallow your own saliva or if you have any other new or concerning symptoms.

## 2022-01-07 NOTE — ED Triage Notes (Signed)
Pt reports 1 week of illness. He states it started "like a sinus infection" then sinuses seemed better but he was febrile. 2 days ago he reports L sided neck pain (not throat), and today states he has L sided neck, throat and ear pain. He reports pain with swallowing but pt is able to maintain his own secretions. Daughter had strep throat 3 weeks ago.

## 2022-11-18 ENCOUNTER — Encounter (HOSPITAL_COMMUNITY): Payer: Self-pay

## 2022-11-18 ENCOUNTER — Emergency Department (HOSPITAL_COMMUNITY): Payer: BLUE CROSS/BLUE SHIELD

## 2022-11-18 ENCOUNTER — Other Ambulatory Visit: Payer: Self-pay

## 2022-11-18 ENCOUNTER — Inpatient Hospital Stay (HOSPITAL_COMMUNITY)
Admission: EM | Admit: 2022-11-18 | Discharge: 2022-11-21 | DRG: 062 | Disposition: A | Discharge status: Home / Self Care | Payer: BLUE CROSS/BLUE SHIELD | Attending: Neurology | Admitting: Neurology

## 2022-11-18 DIAGNOSIS — E669 Obesity, unspecified: Secondary | ICD-10-CM | POA: Diagnosis present

## 2022-11-18 DIAGNOSIS — Z79890 Hormone replacement therapy (postmenopausal): Secondary | ICD-10-CM | POA: Diagnosis not present

## 2022-11-18 DIAGNOSIS — Z833 Family history of diabetes mellitus: Secondary | ICD-10-CM

## 2022-11-18 DIAGNOSIS — I639 Cerebral infarction, unspecified: Principal | ICD-10-CM

## 2022-11-18 DIAGNOSIS — R29810 Facial weakness: Secondary | ICD-10-CM | POA: Diagnosis present

## 2022-11-18 DIAGNOSIS — I429 Cardiomyopathy, unspecified: Secondary | ICD-10-CM | POA: Diagnosis present

## 2022-11-18 DIAGNOSIS — G8191 Hemiplegia, unspecified affecting right dominant side: Secondary | ICD-10-CM | POA: Diagnosis present

## 2022-11-18 DIAGNOSIS — I10 Essential (primary) hypertension: Secondary | ICD-10-CM | POA: Diagnosis present

## 2022-11-18 DIAGNOSIS — Z888 Allergy status to other drugs, medicaments and biological substances status: Secondary | ICD-10-CM | POA: Diagnosis not present

## 2022-11-18 DIAGNOSIS — I6381 Other cerebral infarction due to occlusion or stenosis of small artery: Secondary | ICD-10-CM | POA: Diagnosis present

## 2022-11-18 DIAGNOSIS — G9349 Other encephalopathy: Secondary | ICD-10-CM | POA: Diagnosis present

## 2022-11-18 DIAGNOSIS — R471 Dysarthria and anarthria: Secondary | ICD-10-CM | POA: Diagnosis present

## 2022-11-18 DIAGNOSIS — E785 Hyperlipidemia, unspecified: Secondary | ICD-10-CM | POA: Diagnosis present

## 2022-11-18 DIAGNOSIS — Z6835 Body mass index (BMI) 35.0-35.9, adult: Secondary | ICD-10-CM | POA: Diagnosis not present

## 2022-11-18 DIAGNOSIS — R4701 Aphasia: Secondary | ICD-10-CM | POA: Diagnosis present

## 2022-11-18 DIAGNOSIS — I6389 Other cerebral infarction: Secondary | ICD-10-CM | POA: Diagnosis not present

## 2022-11-18 DIAGNOSIS — Z23 Encounter for immunization: Secondary | ICD-10-CM | POA: Diagnosis not present

## 2022-11-18 DIAGNOSIS — Z79899 Other long term (current) drug therapy: Secondary | ICD-10-CM | POA: Diagnosis not present

## 2022-11-18 DIAGNOSIS — R29716 NIHSS score 16: Secondary | ICD-10-CM | POA: Diagnosis present

## 2022-11-18 LAB — COMPREHENSIVE METABOLIC PANEL
ALT: 34 U/L (ref 0–44)
AST: 35 U/L (ref 15–41)
Albumin: 3.5 g/dL (ref 3.5–5.0)
Alkaline Phosphatase: 42 U/L (ref 38–126)
Anion gap: 10 (ref 5–15)
BUN: 11 mg/dL (ref 6–20)
CO2: 24 mmol/L (ref 22–32)
Calcium: 9.2 mg/dL (ref 8.9–10.3)
Chloride: 106 mmol/L (ref 98–111)
Creatinine, Ser: 1.31 mg/dL — ABNORMAL HIGH (ref 0.61–1.24)
GFR, Estimated: >60 mL/min (ref >60)
Glucose, Bld: 135 mg/dL — ABNORMAL HIGH (ref 70–99)
Potassium: 3.6 mmol/L (ref 3.5–5.1)
Sodium: 140 mmol/L (ref 135–145)
Total Bilirubin: 0.8 mg/dL (ref <1.2)
Total Protein: 6.8 g/dL (ref 6.5–8.1)

## 2022-11-18 LAB — I-STAT CHEM 8, ED
BUN: 13 mg/dL (ref 6–20)
Calcium, Ion: 1.11 mmol/L — ABNORMAL LOW (ref 1.15–1.40)
Chloride: 103 mmol/L (ref 98–111)
Creatinine, Ser: 1.3 mg/dL — ABNORMAL HIGH (ref 0.61–1.24)
Glucose, Bld: 130 mg/dL — ABNORMAL HIGH (ref 70–99)
HCT: 50 % (ref 39.0–52.0)
Hemoglobin: 17 g/dL (ref 13.0–17.0)
Potassium: 3.5 mmol/L (ref 3.5–5.1)
Sodium: 141 mmol/L (ref 135–145)
TCO2: 25 mmol/L (ref 22–32)

## 2022-11-18 LAB — ETHANOL: Alcohol, Ethyl (B): <10 mg/dL (ref <10)

## 2022-11-18 LAB — PROTIME-INR
INR: 1 (ref 0.8–1.2)
Prothrombin Time: 12.8 s (ref 11.4–15.2)

## 2022-11-18 LAB — DIFFERENTIAL
Abs Immature Granulocytes: 0.01 10*3/uL (ref 0.00–0.07)
Basophils Absolute: 0 10*3/uL (ref 0.0–0.1)
Basophils Relative: 1 %
Eosinophils Absolute: 0.1 10*3/uL (ref 0.0–0.5)
Eosinophils Relative: 2 %
Immature Granulocytes: 0 %
Lymphocytes Relative: 40 %
Lymphs Abs: 1.7 10*3/uL (ref 0.7–4.0)
Monocytes Absolute: 0.5 10*3/uL (ref 0.1–1.0)
Monocytes Relative: 12 %
Neutro Abs: 1.9 10*3/uL (ref 1.7–7.7)
Neutrophils Relative %: 45 %

## 2022-11-18 LAB — CBC
HCT: 50.2 % (ref 39.0–52.0)
Hemoglobin: 15.8 g/dL (ref 13.0–17.0)
MCH: 27.6 pg (ref 26.0–34.0)
MCHC: 31.5 g/dL (ref 30.0–36.0)
MCV: 87.8 fL (ref 80.0–100.0)
Platelets: 173 10*3/uL (ref 150–400)
RBC: 5.72 MIL/uL (ref 4.22–5.81)
RDW: 14.6 % (ref 11.5–15.5)
WBC: 4.2 10*3/uL (ref 4.0–10.5)
nRBC: 0 % (ref 0.0–0.2)

## 2022-11-18 LAB — CBG MONITORING, ED: Glucose-Capillary: 137 mg/dL — ABNORMAL HIGH (ref 70–99)

## 2022-11-18 LAB — APTT: aPTT: 25 s (ref 24–36)

## 2022-11-18 MED ORDER — TENECTEPLASE FOR STROKE
25.0000 mg | PACK | Freq: Once | INTRAVENOUS | Status: AC
  Administered 2022-11-18: 25 mg via INTRAVENOUS
  Filled 2022-11-18: qty 10

## 2022-11-18 MED ORDER — ACETAMINOPHEN 160 MG/5ML PO SOLN: 650.0000 mg | ORAL | Status: DC | PRN

## 2022-11-18 MED ORDER — SODIUM CHLORIDE 0.9 % IV SOLN: INTRAVENOUS | Status: DC

## 2022-11-18 MED ORDER — SENNOSIDES-DOCUSATE SODIUM 8.6-50 MG PO TABS
1.0000 | ORAL_TABLET | Freq: Every evening | ORAL | Status: DC | PRN
Start: 2022-11-18 — End: 2022-11-21

## 2022-11-18 MED ORDER — ACETAMINOPHEN 650 MG RE SUPP: 650.0000 mg | RECTAL | Status: DC | PRN

## 2022-11-18 MED ORDER — IOHEXOL 350 MG/ML SOLN
75.0000 mL | Freq: Once | INTRAVENOUS | Status: AC | PRN
  Administered 2022-11-18: 75 mL via INTRAVENOUS

## 2022-11-18 MED ORDER — IOHEXOL 350 MG/ML SOLN
40.0000 mL | Freq: Once | INTRAVENOUS | Status: AC | PRN
Start: 2022-11-18 — End: 2022-11-18
  Administered 2022-11-18: 40 mL via INTRAVENOUS

## 2022-11-18 MED ORDER — TENECTEPLASE FOR STROKE
25.0000 mg | PACK | Freq: Once | INTRAVENOUS | Status: DC
  Filled 2022-11-18: qty 10

## 2022-11-18 MED ORDER — ACETAMINOPHEN 325 MG PO TABS: 650.0000 mg | ORAL_TABLET | ORAL | Status: DC | PRN

## 2022-11-18 MED ORDER — STROKE: EARLY STAGES OF RECOVERY BOOK
Freq: Once | Status: AC
  Filled 2022-11-18: qty 1

## 2022-11-18 MED ORDER — PANTOPRAZOLE SODIUM 40 MG IV SOLR
40.0000 mg | Freq: Every day | INTRAVENOUS | Status: DC
  Administered 2022-11-18: 40 mg via INTRAVENOUS
  Filled 2022-11-18: qty 10

## 2022-11-18 NOTE — Progress Notes (Signed)
PHARMACIST CODE STROKE RESPONSE  Notified to mix TNK at 2223 by Dr. Erick Blinks  TNK preparation completed at 2224 TNK given at 2225  TNK dose = 25 mg IV over 5 seconds  Issues/delays encountered (if applicable):  No delay, BP prior to given TNK 122/69  Marja Kays 11/18/22 10:26 PM

## 2022-11-18 NOTE — ED Triage Notes (Signed)
Patient called EMS because he was seeing spots and slurred speech. When EMS arrived he was sitting on the floor with slurred speech, speech improved when patient on the EMS truck then worsened again. Severe slurred speech noted arrival to ED.

## 2022-11-18 NOTE — Code Documentation (Signed)
Responded to Code Stroke called at 2155 for R sided facial droop, R arm weakness, and aphasia, [id number]. Pt arrived at 2209, CBG-137, NIH-16, CT head negative for acute changes. TNK given at 2225, CTA/CTP-no LVO. Pt taken to MRI at 2245. 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 Pt not a candidate for IR intervention. Plan ICU admission/stroke workup. Please do VS/neuro checks q49m x 2h, q75m x 6h, then q1h x 16h.

## 2022-11-18 NOTE — ED Provider Notes (Signed)
[place] EMERGENCY DEPARTMENT AT [facility] Provider Note   CSN: [id number] Arrival date & time: 11/18/22  2214  An emergency department physician performed an initial assessment on this suspected stroke patient at 2236 [phone]).  History  Chief Complaint  Patient presents with   Code Stroke    Gabriel Barnett is a 57 y.o. male presented emerged apartment with complaint of right-sided weakness, slurred speech, seeing spots.  Per EMS report, patient last seen well at 2115.  Was noted a right-sided facial droop and slurred speech by EMS.  Arrives as a code stroke activation.  Seen by neurology as well as myself  HPI     Home Medications Prior to Admission medications   Medication Sig Start Date End Date Taking? Authorizing Provider  finasteride (PROSCAR) 5 MG tablet Take 5 mg by mouth daily. 08/24/22  Yes [provider]  hydrochlorothiazide (HYDRODIURIL) 25 MG tablet Take 25 mg by mouth daily. 10/22/22  Yes [provider]  Multiple Vitamin (MULTIVITAMIN WITH MINERALS) TABS tablet Take 1 tablet by mouth daily.   Yes [provider]  omega-3 acid ethyl esters (LOVAZA) 1 g capsule Take 1 capsule by mouth daily.   Yes [provider]  Testosterone Cypionate (TESTONE CIK) 200 MG/ML KIT Inject 100 mg into the muscle every 14 (fourteen) days.   Yes [provider]      Allergies    Atorvastatin    Review of Systems   Review of Systems  Physical Exam Updated Vital Signs BP 102/65   Pulse 64   Temp 97.8 F (36.6 C) (Oral)   Resp 18   Ht 6' 1.5" (1.867 m)   Wt 125 kg   SpO2 94%   BMI 35.86 kg/m  Physical Exam Constitutional:      General: He is not in acute distress. HENT:     Head: Normocephalic and atraumatic.  Eyes:     Conjunctiva/sclera: Conjunctivae normal.     Pupils: Pupils are equal, round, and reactive to light.  Cardiovascular:     Rate and Rhythm: Normal rate and regular rhythm.  Pulmonary:      Effort: Pulmonary effort is normal. No respiratory distress.  Abdominal:     General: There is no distension.     Tenderness: There is no abdominal tenderness.  Skin:    General: Skin is warm and dry.  Neurological:     Mental Status: He is alert.     Comments: Speech slurred, mild right-sided facial droop, weakness with right grip strength, no pronator drift noted on exam, left-sided strength and sensation reportedly intact, left gaze deviation preference  Psychiatric:        Mood and Affect: Mood normal.        Behavior: Behavior normal.     ED Results / Procedures / Treatments   Labs (all labs ordered are listed, but only abnormal results are displayed) Labs Reviewed  COMPREHENSIVE METABOLIC PANEL - Abnormal; Notable for the following components:      Result Value   Glucose, Bld 135 (*)    Creatinine, Ser 1.31 (*)    All other components within normal limits  LIPID PANEL - Abnormal; Notable for the following components:   HDL 35 (*)    LDL Cholesterol 108 (*)    All other components within normal limits  HEMOGLOBIN A1C - Abnormal; Notable for the following components:   Hgb A1c MFr Bld 6.5 (*)    All other components within normal limits  CBG MONITORING, ED - Abnormal; Notable for the following components:   Glucose-Capillary 137 (*)    All other components within normal limits  I-STAT CHEM 8, ED - Abnormal; Notable for the following components:   Creatinine, Ser 1.30 (*)    Glucose, Bld 130 (*)    Calcium, Ion 1.11 (*)    All other components within normal limits  MRSA NEXT GEN BY PCR, NASAL  ETHANOL  PROTIME-INR  APTT  CBC  DIFFERENTIAL  HIV ANTIBODY (ROUTINE TESTING W REFLEX)  RAPID URINE DRUG SCREEN, HOSP PERFORMED    EKG EKG Interpretation Date/Time:  "Sunday November 18 2022 23:23:02 EST Ventricular Rate:  79 PR Interval:  164 QRS Duration:  107 QT Interval:  406 QTC Calculation: 466 R Axis:   -31  Text Interpretation: Sinus rhythm Multiple premature  complexes, vent & supraven Left axis deviation Low voltage, precordial leads RSR' in V1 or V2, right VCD or RVH ST elevation, consider inferior injury Technically poor tracing If clinical findings warrant, repeat tracings suggested Confirmed by Countryman, Chase ([id number]) on 11/19/2022 2:02:13 PM  Radiology ECHOCARDIOGRAM COMPLETE BUBBLE STUDY  Result Date: 11/19/2022    ECHOCARDIOGRAM REPORT   Patient Name:   Gabriel Barnett Date of Exam: 11/19/2022 Medical Rec #:  [id number]      Height:       73.5 in Accession #:    [id number]     Weight:       275.6 lb Date of Birth:  01/18/1965      BSA:          2.478 m Patient Age:    57 years       BP:           124/76 mmHg Patient Gender: M              HR:           70"  bpm. Exam Location:  Inpatient Procedure: 2D Echo, Color Doppler, Cardiac Doppler and Saline Contrast Bubble            Study Indications:    Stroke i63.9  History:        Patient has no prior history of Echocardiogram examinations.                 Risk Factors:Hypertension and Dyslipidemia.  Sonographer:    Irving Burton Senior RDCS Referring Phys: [id number] [facility] IMPRESSIONS  1. Left ventricular ejection fraction, by estimation, is 45 to 50%. The left ventricle has mildly decreased function. The left ventricle demonstrates global hypokinesis. There is mild concentric left ventricular hypertrophy. Left ventricular diastolic parameters are consistent with Grade I diastolic dysfunction (impaired relaxation).  2. Right ventricular systolic function is mildly reduced. The right ventricular size is mildly enlarged. There is normal pulmonary artery systolic pressure. The estimated right ventricular systolic pressure is 21.2 mmHg.  3. Right atrial size was mildly dilated.  4. The mitral valve is normal in structure. Trivial mitral valve regurgitation. No evidence of mitral stenosis.  5. The aortic valve is tricuspid. Aortic valve regurgitation is not visualized. No aortic stenosis is present.  6. The inferior  vena cava is normal in size with <50% respiratory variability, suggesting right atrial pressure of 8 mmHg.  7. Agitated saline contrast bubble study was negative, with no evidence of any interatrial shunt. FINDINGS  Left Ventricle: Left ventricular ejection fraction, by estimation, is 45 to 50%. The left ventricle has mildly decreased function. The left ventricle demonstrates global hypokinesis. The left  ventricular internal cavity size was normal in size. There is  mild concentric left ventricular hypertrophy. Left ventricular diastolic parameters are consistent with Grade I diastolic dysfunction (impaired relaxation). Right Ventricle: The right ventricular size is mildly enlarged. No increase in right ventricular wall thickness. Right ventricular systolic function is mildly reduced. There is normal pulmonary artery systolic pressure. The tricuspid regurgitant velocity  is 1.82 m/s, and with an assumed right atrial pressure of 8 mmHg, the estimated right ventricular systolic pressure is 21.2 mmHg. Left Atrium: Left atrial size was normal in size. Right Atrium: Right atrial size was mildly dilated. Pericardium: There is no evidence of pericardial effusion. Mitral Valve: The mitral valve is normal in structure. Mild mitral annular calcification. Trivial mitral valve regurgitation. No evidence of mitral valve stenosis. Tricuspid Valve: The tricuspid valve is normal in structure. Tricuspid valve regurgitation is trivial. Aortic Valve: The aortic valve is tricuspid. Aortic valve regurgitation is not visualized. No aortic stenosis is present. Pulmonic Valve: The pulmonic valve was normal in structure. Pulmonic valve regurgitation is trivial. Aorta: The aortic root and ascending aorta are structurally normal, with no evidence of dilitation. Venous: The inferior vena cava is normal in size with less than 50% respiratory variability, suggesting right atrial pressure of 8 mmHg. IAS/Shunts: No atrial level shunt detected by  color flow Doppler. Agitated saline contrast was given intravenously to evaluate for intracardiac shunting. Agitated saline contrast bubble study was negative, with no evidence of any interatrial shunt.  LEFT VENTRICLE PLAX 2D LVIDd:         4.80 cm   Diastology LVIDs:         3.50 cm   LV e' medial:    5.77 cm/s LV PW:         1.20 cm   LV E/e' medial:  6.0 LV IVS:        1.20 cm   LV e' lateral:   7.29 cm/s LVOT diam:     2.50 cm   LV E/e' lateral: 4.8 LV SV:         82 LV SV Index:   33 LVOT Area:     4.91 cm  RIGHT VENTRICLE RV S prime:     12.10 cm/s TAPSE (M-mode): 2.1 cm LEFT ATRIUM             Index        RIGHT ATRIUM           Index LA diam:        3.20 cm 1.29 cm/m   RA Area:     23.60 cm LA Vol (A2C):   51.8 ml 20.90 ml/m  RA Volume:   75.40 ml  30.43 ml/m LA Vol (A4C):   46.8 ml 18.88 ml/m LA Biplane Vol: 51.1 ml 20.62 ml/m  AORTIC VALVE LVOT Vmax:   86.20 cm/s LVOT Vmean:  64.100 cm/s LVOT VTI:    0.167 m  AORTA Ao Root diam: 3.70 cm Ao Asc diam:  3.30 cm MITRAL VALVE               TRICUSPID VALVE MV Area (PHT): 2.20 cm    TR Peak grad:   13.2 mmHg MV Decel Time: 345 msec    TR Vmax:        182.00 cm/s MV E velocity: 34.70 cm/s MV A velocity: 43.70 cm/s  SHUNTS MV E/A ratio:  0.79        Systemic VTI:  0.17 m  Systemic Diam: 2.50 cm Dalton McleanMD Electronically signed by Wilfred Lacy Signature Date/Time: 11/19/2022/9:27:58 AM    Final    MR BRAIN WO CONTRAST  Result Date: 11/18/2022 CLINICAL DATA:  Stroke suspected EXAM: MRI HEAD WITHOUT CONTRAST TECHNIQUE: Multiplanar, multiecho pulse sequences of the brain and surrounding structures were obtained without intravenous contrast. COMPARISON:  No prior MRI available, correlation is made with CT head 11/18/2022 FINDINGS: Brain: Subtle restricted diffusion with ADC correlate in the medial left thalamus (series 5, image 79 and series 6, image 27), which is not associated with significantly increased T2 hyperintense  signal, consistent with acute infarct. No acute hemorrhage, mass, mass effect, or midline shift. No hydrocephalus or extra-axial collection. Pituitary and craniocervical junction within normal limits. No hemosiderin deposition to suggest remote hemorrhage. Vascular: Normal arterial flow voids. Skull and upper cervical spine: Normal marrow signal. Sinuses/Orbits: Clear paranasal sinuses. No acute finding in the orbits. Other: The mastoid air cells are well aerated. 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 These findings were discussed on 11/18/2022 at 11:05 pm with provider [facility] . Electronically Signed   By: Wiliam Ke M.D.   On: 11/18/2022 23:12   CT CEREBRAL PERFUSION W CONTRAST  Result Date: 11/18/2022 CLINICAL DATA:  Acute ischemic left MCA stroke EXAM: CT PERFUSION BRAIN TECHNIQUE: Multiphase CT imaging of the brain was performed following IV bolus contrast injection. Subsequent parametric perfusion maps were calculated using RAPID software. RADIATION DOSE REDUCTION: This exam was performed according to the departmental dose-optimization program which includes automated exposure control, adjustment of the mA and/or kV according to patient size and/or use of iterative reconstruction technique. CONTRAST:  40mL OMNIPAQUE IOHEXOL 350 MG/ML SOLN COMPARISON:  No prior CT perfusion available, correlation is made with 11/18/2022 CT head FINDINGS: CT Brain Perfusion Findings: CBF (<30%) Volume: 0mL Perfusion (Tmax>6.0s) volume: 9mL Mismatch Volume: 9mL ASPECTS on noncontrast CT Head: 10 at 10:27 p.m. today. Infarct Core: 0 mL Infarction Location:No infarct core detected. The area of decreased perfusion is in the right anterior frontal lobe and right occipital lobe, favored to be artifactual. IMPRESSION: No infarct core detected. The area of decreased perfusion is in the right anterior frontal lobe and right occipital lobe, favored to be artifactual. Attention on subsequent MRI.  Electronically Signed   By: Wiliam Ke M.D.   On: 11/18/2022 22:47   CT ANGIO HEAD NECK W WO CM (CODE STROKE)  Result Date: 11/18/2022 CLINICAL DATA:  Left MCA territory deficits EXAM: CT ANGIOGRAPHY HEAD AND NECK TECHNIQUE: Multidetector CT imaging of the head and neck was performed using the standard protocol during bolus administration of intravenous contrast. Multiplanar CT image reconstructions and MIPs were obtained to evaluate the vascular anatomy. Carotid stenosis measurements (when applicable) are obtained utilizing NASCET criteria, using the distal internal carotid diameter as the denominator. RADIATION DOSE REDUCTION: This exam was performed according to the departmental dose-optimization program which includes automated exposure control, adjustment of the mA and/or kV according to patient size and/or use of iterative reconstruction technique. CONTRAST:  75mL OMNIPAQUE IOHEXOL 350 MG/ML SOLN COMPARISON:  No prior CTA or CT perfusion available, correlation is made with CT head 11/18/2022 FINDINGS: CT HEAD FINDINGS For noncontrast findings, please see same day CT head. CTA NECK FINDINGS Aortic arch: Evaluation of the arch is limited by beam hardening artifact from the adjacent contrast bolus. Right carotid system: No evidence of dissection, occlusion, or hemodynamically significant stenosis (greater than 50%). Left carotid system: No evidence of dissection, occlusion, or hemodynamically significant  stenosis (greater than 50%). Vertebral arteries: No evidence of dissection, occlusion, or hemodynamically significant stenosis (greater than 50%). Skeleton: No acute osseous abnormality. Degenerative changes in the cervical spine. Other neck: No acute finding. Upper chest: No focal pulmonary opacity or pleural effusion. Review of the MIP images confirms the above findings CTA HEAD FINDINGS Anterior circulation: Both internal carotid arteries are patent to the termini, without significant stenosis. A1  segments patent. Normal anterior communicating artery. The right A2 and A3s are not definitively visualized, which may indicate occlusion versus an azygous A2 and A3. No M1 stenosis or occlusion. MCA branches perfused to their distal aspects without significant stenosis. Posterior circulation: Vertebral arteries patent to the vertebrobasilar junction without significant stenosis. Posterior inferior cerebellar arteries patent proximally. Basilar patent to its distal aspect without significant stenosis. Superior cerebellar arteries patent proximally. Quite diminutive P1 segments, with near fetal origin of the bilateral PCAs from the posterior communicating arteries. The PCAs are quite diminutive bilaterally, without focal stenosis. Venous sinuses: Not well opacified due to phase of timing. Anatomic variants: None significant. No evidence of aneurysm or vascular malformation. Review of the MIP images confirms the above findings IMPRESSION: 1. The right A2 and A3s are not definitively visualized, which may indicate occlusion versus an azygous A2 and A3. 2. No other intracranial large vessel occlusion or significant stenosis. 3. No hemodynamically significant stenosis in the neck. Electronically Signed   By: Wiliam Ke M.D.   On: 11/18/2022 22:36   CT HEAD CODE STROKE WO CONTRAST  Result Date: 11/18/2022 CLINICAL DATA:  Code stroke.  Stroke suspected EXAM: CT HEAD WITHOUT CONTRAST TECHNIQUE: Contiguous axial images were obtained from the base of the skull through the vertex without intravenous contrast. RADIATION DOSE REDUCTION: This exam was performed according to the departmental dose-optimization program which includes automated exposure control, adjustment of the mA and/or kV according to patient size and/or use of iterative reconstruction technique. COMPARISON:  None Available. FINDINGS: Brain: No evidence of acute infarction, hemorrhage, mass, mass effect, or midline shift. No hydrocephalus or extra-axial  collection. Remote infarct in the right cerebellum. Vascular: No hyperdense vessel. Skull: Negative for fracture or focal lesion. Sinuses/Orbits: No acute finding. Other: The mastoid air cells are well aerated. ASPECTS [facility] Stroke Program Early CT Score) - Ganglionic level infarction (caudate, lentiform nuclei, internal capsule, insula, M1-M3 cortex): 7 - Supraganglionic infarction (M4-M6 cortex): 3 Total score (0-10 with 10 being normal): 10 IMPRESSION: No acute intracranial process. ASPECTS is 10. Code stroke imaging results were communicated on 11/18/2022 at 10:27 pm to provider Dr. Derry Lory via telephone, who verbally acknowledged these results. Electronically Signed   By: Wiliam Ke M.D.   On: 11/18/2022 22:27    Procedures .Critical Care  Performed by: Terald Sleeper, MD Authorized by: Terald Sleeper, MD   Critical care provider statement:    Critical care time (minutes):  30   Critical care time was exclusive of:  Separately billable procedures and treating other patients   Critical care was necessary to treat or prevent imminent or life-threatening deterioration of the following conditions:  CNS failure or compromise   Critical care was time spent personally by me on the following activities:  Ordering and performing treatments and interventions, ordering and review of laboratory studies, ordering and review of radiographic studies, pulse oximetry, review of old charts, examination of patient and evaluation of patient's response to treatment   Care discussed with: admitting provider   Comments:     Stroke evaluation, tnk administration,  neurology consultation     Medications Ordered in ED Medications  acetaminophen (TYLENOL) tablet 650 mg (has no administration in time range)    Or  acetaminophen (TYLENOL) 160 MG/5ML solution 650 mg (has no administration in time range)    Or  acetaminophen (TYLENOL) suppository 650 mg (has no administration in time range)   senna-docusate (Senokot-S) tablet 1 tablet (has no administration in time range)  Chlorhexidine Gluconate Cloth 2 % PADS 6 each (6 each Topical Given 11/19/22 0957)  Oral care mouth rinse (has no administration in time range)  pantoprazole (PROTONIX) EC tablet 40 mg (40 mg Oral Given 11/19/22 1123)  rosuvastatin (CRESTOR) tablet 20 mg (20 mg Oral Given 11/19/22 1356)  tenecteplase (TNKASE) injection for Stroke 25 mg (25 mg Intravenous Given 11/18/22 2225)  iohexol (OMNIPAQUE) 350 MG/ML injection 75 mL (75 mLs Intravenous Contrast Given 11/18/22 2226)  iohexol (OMNIPAQUE) 350 MG/ML injection 40 mL (40 mLs Intravenous Contrast Given 11/18/22 2242)   stroke: early stages of recovery book ( Does not apply Given 11/19/22 0957)    ED Course/ Medical Decision Making/ A&P                                 Medical Decision Making Amount and/or Complexity of Data Reviewed Labs: ordered. Radiology: ordered.  Risk Decision regarding hospitalization.   This patient presents to the ED with concern for slurred speech, gaze deviation, right-sided weakness. This involves an extensive number of treatment options, and is a complaint that carries with it a high risk of complications and morbidity.  The differential diagnosis includes CVA most likely versus metabolic derangement versus hypoglycemic episode versus other  Additional history obtained from EMS  I ordered and personally interpreted labs.  The pertinent results include: No emergent findings  I ordered imaging studies including CT imaging of the head, CT of the head I independently visualized and interpreted imaging which showed no evident large vessel occlusion I agree with the radiologist interpretation  The patient was maintained on a cardiac monitor.  I personally viewed and interpreted the cardiac monitored which showed an underlying rhythm of: Sinus rhythm  I have reviewed the patients home medicines and have made adjustments as  needed   I requested consultation with the neurology,  and discussed lab and imaging findings as well as pertinent plan - they recommend: Thrombolytic therapy given after discussion between neurologist and patient and or family  After the interventions noted above, I reevaluated the patient and found that they have: improved -patient appears to demonstrate some improvement of facial droop on reexamination as well as improvement of right-sided strength.   Dispostion:  After consideration of the diagnostic results and the patients response to treatment, I feel that the patent would benefit from medical admission.  Plan for stat MRI and stroke unit admission with neurology service         Final Clinical Impression(s) / ED Diagnoses Final diagnoses:  Cerebrovascular accident (CVA), unspecified mechanism (HCC)    Rx / DC Orders ED Discharge Orders     None         Jimmye Wisnieski, Kermit Balo, MD 11/19/22 1430

## 2022-11-18 NOTE — ED Notes (Signed)
CBG 137 

## 2022-11-19 ENCOUNTER — Inpatient Hospital Stay (HOSPITAL_COMMUNITY): Payer: BLUE CROSS/BLUE SHIELD

## 2022-11-19 DIAGNOSIS — I6381 Other cerebral infarction due to occlusion or stenosis of small artery: Secondary | ICD-10-CM

## 2022-11-19 DIAGNOSIS — I6389 Other cerebral infarction: Secondary | ICD-10-CM | POA: Diagnosis not present

## 2022-11-19 LAB — ECHOCARDIOGRAM COMPLETE BUBBLE STUDY
Area-P 1/2: 2.2 cm2
S' Lateral: 3.5 cm

## 2022-11-19 LAB — LIPID PANEL
Cholesterol: 162 mg/dL (ref 0–200)
HDL: 35 mg/dL — ABNORMAL LOW (ref >40)
LDL Cholesterol: 108 mg/dL — ABNORMAL HIGH (ref 0–99)
Total CHOL/HDL Ratio: 4.6 {ratio}
Triglycerides: 95 mg/dL (ref <150)
VLDL: 19 mg/dL (ref 0–40)

## 2022-11-19 LAB — RAPID URINE DRUG SCREEN, HOSP PERFORMED
Amphetamines: NOT DETECTED
Barbiturates: NOT DETECTED
Benzodiazepines: NOT DETECTED
Cocaine: NOT DETECTED
Opiates: NOT DETECTED
Tetrahydrocannabinol: NOT DETECTED

## 2022-11-19 LAB — HEMOGLOBIN A1C
Hgb A1c MFr Bld: 6.5 % — ABNORMAL HIGH (ref 4.8–5.6)
Mean Plasma Glucose: 139.85 mg/dL

## 2022-11-19 LAB — MRSA NEXT GEN BY PCR, NASAL: MRSA by PCR Next Gen: NOT DETECTED

## 2022-11-19 LAB — HIV ANTIBODY (ROUTINE TESTING W REFLEX): HIV Screen 4th Generation wRfx: NONREACTIVE

## 2022-11-19 MED ORDER — CHLORHEXIDINE GLUCONATE CLOTH 2 % EX PADS
6.0000 | MEDICATED_PAD | Freq: Every day | CUTANEOUS | Status: DC
  Administered 2022-11-19: 6 via TOPICAL

## 2022-11-19 MED ORDER — PANTOPRAZOLE SODIUM 40 MG PO TBEC
40.0000 mg | DELAYED_RELEASE_TABLET | Freq: Every day | ORAL | Status: DC
  Administered 2022-11-19 – 2022-11-21 (×3): 40 mg via ORAL
  Filled 2022-11-19 (×3): qty 1

## 2022-11-19 MED ORDER — ROSUVASTATIN CALCIUM 20 MG PO TABS
20.0000 mg | ORAL_TABLET | Freq: Every day | ORAL | Status: DC
  Administered 2022-11-19 – 2022-11-21 (×3): 20 mg via ORAL
  Filled 2022-11-19 (×3): qty 1

## 2022-11-19 MED ORDER — ORAL CARE MOUTH RINSE: 15.0000 mL | OROMUCOSAL | Status: DC | PRN

## 2022-11-19 NOTE — Progress Notes (Addendum)
STROKE TEAM PROGRESS NOTE   BRIEF HPI Mr. Gabriel Barnett is a 57 y.o. male with history of HTN, HLD who presents with R sided weakness, slurred speech, aphasia/encephalopathy and somnolence. NIHSS 16. MRI shows left thalamic infarct.   SIGNIFICANT HOSPITAL EVENTS 11/10- Received TNK at 2320  INTERIM HISTORY/SUBJECTIVE CT Head tonight at 2200.Marland Kitchen NIH is an 8. Drift noted in right arm.  Vital signs stable, BP goal less than 180/105  OBJECTIVE  CBC    Component Value Date/Time   WBC 4.2 11/18/2022 2210   RBC 5.72 11/18/2022 2210   HGB 17.0 11/18/2022 2215   HCT 50.0 11/18/2022 2215   PLT 173 11/18/2022 2210   MCV 87.8 11/18/2022 2210   MCH 27.6 11/18/2022 2210   MCHC 31.5 11/18/2022 2210   RDW 14.6 11/18/2022 2210   LYMPHSABS 1.7 11/18/2022 2210   MONOABS 0.5 11/18/2022 2210   EOSABS 0.1 11/18/2022 2210   BASOSABS 0.0 11/18/2022 2210    BMET    Component Value Date/Time   NA 141 11/18/2022 2215   K 3.5 11/18/2022 2215   CL 103 11/18/2022 2215   CO2 24 11/18/2022 2210   GLUCOSE 130 (H) 11/18/2022 2215   BUN 13 11/18/2022 2215   CREATININE 1.30 (H) 11/18/2022 2215   CALCIUM 9.2 11/18/2022 2210   GFRNONAA >60 11/18/2022 2210    IMAGING past 24 hours ECHOCARDIOGRAM COMPLETE BUBBLE STUDY  Result Date: 11/19/2022    ECHOCARDIOGRAM REPORT   Patient Name:   SHEN REITMEIER Date of Exam: 11/19/2022 Medical Rec #:  [id number]      Height:       73.5 in Accession #:    [id number]     Weight:       275.6 lb Date of Birth:  January 20, 1965      BSA:          2.478 m Patient Age:    57 years       BP:           124/76 mmHg Patient Gender: M              HR:           70 bpm. Exam Location:  Inpatient Procedure: 2D Echo, Color Doppler, Cardiac Doppler and Saline Contrast Bubble            Study Indications:    Stroke i63.9  History:        Patient has no prior history of Echocardiogram examinations.                 Risk Factors:Hypertension and Dyslipidemia.  Sonographer:    Irving Burton Senior RDCS  Referring Phys: [id number] [facility] IMPRESSIONS  1. Left ventricular ejection fraction, by estimation, is 45 to 50%. The left ventricle has mildly decreased function. The left ventricle demonstrates global hypokinesis. There is mild concentric left ventricular hypertrophy. Left ventricular diastolic parameters are consistent with Grade I diastolic dysfunction (impaired relaxation).  2. Right ventricular systolic function is mildly reduced. The right ventricular size is mildly enlarged. There is normal pulmonary artery systolic pressure. The estimated right ventricular systolic pressure is 21.2 mmHg.  3. Right atrial size was mildly dilated.  4. The mitral valve is normal in structure. Trivial mitral valve regurgitation. No evidence of mitral stenosis.  5. The aortic valve is tricuspid. Aortic valve regurgitation is not visualized. No aortic stenosis is present.  6. The inferior vena cava is normal in size with <50% respiratory variability, suggesting right atrial pressure  of 8 mmHg.  7. Agitated saline contrast bubble study was negative, with no evidence of any interatrial shunt. FINDINGS  Left Ventricle: Left ventricular ejection fraction, by estimation, is 45 to 50%. The left ventricle has mildly decreased function. The left ventricle demonstrates global hypokinesis. The left ventricular internal cavity size was normal in size. There is  mild concentric left ventricular hypertrophy. Left ventricular diastolic parameters are consistent with Grade I diastolic dysfunction (impaired relaxation). Right Ventricle: The right ventricular size is mildly enlarged. No increase in right ventricular wall thickness. Right ventricular systolic function is mildly reduced. There is normal pulmonary artery systolic pressure. The tricuspid regurgitant velocity  is 1.82 m/s, and with an assumed right atrial pressure of 8 mmHg, the estimated right ventricular systolic pressure is 21.2 mmHg. Left Atrium: Left atrial size was  normal in size. Right Atrium: Right atrial size was mildly dilated. Pericardium: There is no evidence of pericardial effusion. Mitral Valve: The mitral valve is normal in structure. Mild mitral annular calcification. Trivial mitral valve regurgitation. No evidence of mitral valve stenosis. Tricuspid Valve: The tricuspid valve is normal in structure. Tricuspid valve regurgitation is trivial. Aortic Valve: The aortic valve is tricuspid. Aortic valve regurgitation is not visualized. No aortic stenosis is present. Pulmonic Valve: The pulmonic valve was normal in structure. Pulmonic valve regurgitation is trivial. Aorta: The aortic root and ascending aorta are structurally normal, with no evidence of dilitation. Venous: The inferior vena cava is normal in size with less than 50% respiratory variability, suggesting right atrial pressure of 8 mmHg. IAS/Shunts: No atrial level shunt detected by color flow Doppler. Agitated saline contrast was given intravenously to evaluate for intracardiac shunting. Agitated saline contrast bubble study was negative, with no evidence of any interatrial shunt.  LEFT VENTRICLE PLAX 2D LVIDd:         4.80 cm   Diastology LVIDs:         3.50 cm   LV e' medial:    5.77 cm/s LV PW:         1.20 cm   LV E/e' medial:  6.0 LV IVS:        1.20 cm   LV e' lateral:   7.29 cm/s LVOT diam:     2.50 cm   LV E/e' lateral: 4.8 LV SV:         82 LV SV Index:   33 LVOT Area:     4.91 cm  RIGHT VENTRICLE RV S prime:     12.10 cm/s TAPSE (M-mode): 2.1 cm LEFT ATRIUM             Index        RIGHT ATRIUM           Index LA diam:        3.20 cm 1.29 cm/m   RA Area:     23.60 cm LA Vol (A2C):   51.8 ml 20.90 ml/m  RA Volume:   75.40 ml  30.43 ml/m LA Vol (A4C):   46.8 ml 18.88 ml/m LA Biplane Vol: 51.1 ml 20.62 ml/m  AORTIC VALVE LVOT Vmax:   86.20 cm/s LVOT Vmean:  64.100 cm/s LVOT VTI:    0.167 m  AORTA Ao Root diam: 3.70 cm Ao Asc diam:  3.30 cm MITRAL VALVE               TRICUSPID VALVE MV Area (PHT):  2.20 cm    TR Peak grad:   13.2 mmHg MV Decel Time: 345  msec    TR Vmax:        182.00 cm/s MV E velocity: 34.70 cm/s MV A velocity: 43.70 cm/s  SHUNTS MV E/A ratio:  0.79        Systemic VTI:  0.17 m                            Systemic Diam: 2.50 cm Dalton McleanMD Electronically signed by Wilfred Lacy Signature Date/Time: 11/19/2022/9:27:58 AM    Final    MR BRAIN WO CONTRAST  Result Date: 11/18/2022 CLINICAL DATA:  Stroke suspected EXAM: MRI HEAD WITHOUT CONTRAST TECHNIQUE: Multiplanar, multiecho pulse sequences of the brain and surrounding structures were obtained without intravenous contrast. COMPARISON:  No prior MRI available, correlation is made with CT head 11/18/2022 FINDINGS: Brain: Subtle restricted diffusion with ADC correlate in the medial left thalamus (series 5, image 79 and series 6, image 27), which is not associated with significantly increased T2 hyperintense signal, consistent with acute infarct. No acute hemorrhage, mass, mass effect, or midline shift. No hydrocephalus or extra-axial collection. Pituitary and craniocervical junction within normal limits. No hemosiderin deposition to suggest remote hemorrhage. Vascular: Normal arterial flow voids. Skull and upper cervical spine: Normal marrow signal. Sinuses/Orbits: Clear paranasal sinuses. No acute finding in the orbits. Other: The mastoid air cells are well aerated. 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 These findings were discussed on 11/18/2022 at 11:05 pm with provider [facility] . Electronically Signed   By: Wiliam Ke M.D.   On: 11/18/2022 23:12   CT CEREBRAL PERFUSION W CONTRAST  Result Date: 11/18/2022 CLINICAL DATA:  Acute ischemic left MCA stroke EXAM: CT PERFUSION BRAIN TECHNIQUE: Multiphase CT imaging of the brain was performed following IV bolus contrast injection. Subsequent parametric perfusion maps were calculated using RAPID software. RADIATION DOSE REDUCTION: This exam was performed  according to the departmental dose-optimization program which includes automated exposure control, adjustment of the mA and/or kV according to patient size and/or use of iterative reconstruction technique. CONTRAST:  40mL OMNIPAQUE IOHEXOL 350 MG/ML SOLN COMPARISON:  No prior CT perfusion available, correlation is made with 11/18/2022 CT head FINDINGS: CT Brain Perfusion Findings: CBF (<30%) Volume: 0mL Perfusion (Tmax>6.0s) volume: 9mL Mismatch Volume: 9mL ASPECTS on noncontrast CT Head: 10 at 10:27 p.m. today. Infarct Core: 0 mL Infarction Location:No infarct core detected. The area of decreased perfusion is in the right anterior frontal lobe and right occipital lobe, favored to be artifactual. IMPRESSION: No infarct core detected. The area of decreased perfusion is in the right anterior frontal lobe and right occipital lobe, favored to be artifactual. Attention on subsequent MRI. Electronically Signed   By: Wiliam Ke M.D.   On: 11/18/2022 22:47   CT ANGIO HEAD NECK W WO CM (CODE STROKE)  Result Date: 11/18/2022 CLINICAL DATA:  Left MCA territory deficits EXAM: CT ANGIOGRAPHY HEAD AND NECK TECHNIQUE: Multidetector CT imaging of the head and neck was performed using the standard protocol during bolus administration of intravenous contrast. Multiplanar CT image reconstructions and MIPs were obtained to evaluate the vascular anatomy. Carotid stenosis measurements (when applicable) are obtained utilizing NASCET criteria, using the distal internal carotid diameter as the denominator. RADIATION DOSE REDUCTION: This exam was performed according to the departmental dose-optimization program which includes automated exposure control, adjustment of the mA and/or kV according to patient size and/or use of iterative reconstruction technique. CONTRAST:  75mL OMNIPAQUE IOHEXOL 350 MG/ML SOLN COMPARISON:  No prior CTA  or CT perfusion available, correlation is made with CT head 11/18/2022 FINDINGS: CT HEAD FINDINGS For  noncontrast findings, please see same day CT head. CTA NECK FINDINGS Aortic arch: Evaluation of the arch is limited by beam hardening artifact from the adjacent contrast bolus. Right carotid system: No evidence of dissection, occlusion, or hemodynamically significant stenosis (greater than 50%). Left carotid system: No evidence of dissection, occlusion, or hemodynamically significant stenosis (greater than 50%). Vertebral arteries: No evidence of dissection, occlusion, or hemodynamically significant stenosis (greater than 50%). Skeleton: No acute osseous abnormality. Degenerative changes in the cervical spine. Other neck: No acute finding. Upper chest: No focal pulmonary opacity or pleural effusion. Review of the MIP images confirms the above findings CTA HEAD FINDINGS Anterior circulation: Both internal carotid arteries are patent to the termini, without significant stenosis. A1 segments patent. Normal anterior communicating artery. The right A2 and A3s are not definitively visualized, which may indicate occlusion versus an azygous A2 and A3. No M1 stenosis or occlusion. MCA branches perfused to their distal aspects without significant stenosis. Posterior circulation: Vertebral arteries patent to the vertebrobasilar junction without significant stenosis. Posterior inferior cerebellar arteries patent proximally. Basilar patent to its distal aspect without significant stenosis. Superior cerebellar arteries patent proximally. Quite diminutive P1 segments, with near fetal origin of the bilateral PCAs from the posterior communicating arteries. The PCAs are quite diminutive bilaterally, without focal stenosis. Venous sinuses: Not well opacified due to phase of timing. Anatomic variants: None significant. No evidence of aneurysm or vascular malformation. Review of the MIP images confirms the above findings IMPRESSION: 1. The right A2 and A3s are not definitively visualized, which may indicate occlusion versus an azygous  A2 and A3. 2. No other intracranial large vessel occlusion or significant stenosis. 3. No hemodynamically significant stenosis in the neck. Electronically Signed   By: Wiliam Ke M.D.   On: 11/18/2022 22:36   CT HEAD CODE STROKE WO CONTRAST  Result Date: 11/18/2022 CLINICAL DATA:  Code stroke.  Stroke suspected EXAM: CT HEAD WITHOUT CONTRAST TECHNIQUE: Contiguous axial images were obtained from the base of the skull through the vertex without intravenous contrast. RADIATION DOSE REDUCTION: This exam was performed according to the departmental dose-optimization program which includes automated exposure control, adjustment of the mA and/or kV according to patient size and/or use of iterative reconstruction technique. COMPARISON:  None Available. FINDINGS: Brain: No evidence of acute infarction, hemorrhage, mass, mass effect, or midline shift. No hydrocephalus or extra-axial collection. Remote infarct in the right cerebellum. Vascular: No hyperdense vessel. Skull: Negative for fracture or focal lesion. Sinuses/Orbits: No acute finding. Other: The mastoid air cells are well aerated. ASPECTS [facility] Stroke Program Early CT Score) - Ganglionic level infarction (caudate, lentiform nuclei, internal capsule, insula, M1-M3 cortex): 7 - Supraganglionic infarction (M4-M6 cortex): 3 Total score (0-10 with 10 being normal): 10 IMPRESSION: No acute intracranial process. ASPECTS is 10. Code stroke imaging results were communicated on 11/18/2022 at 10:27 pm to provider Dr. Derry Lory via telephone, who verbally acknowledged these results. Electronically Signed   By: Wiliam Ke M.D.   On: 11/18/2022 22:27    Vitals:   11/19/22 0630 11/19/22 0700 11/19/22 0800 11/19/22 0900  BP: 137/84 117/79 124/76 124/84  Pulse: 76 72 72 66  Resp: 16 17 18 17   Temp:   98 F (36.7 C)   TempSrc:   Axillary   SpO2: (!) 89% 92% 94% 94%  Weight:      Height:  PHYSICAL EXAM General:  Alert, well-nourished,  well-developed patient in no acute distress Psych:  Mood and affect appropriate for situation CV: Regular rate and rhythm on monitor Respiratory:  Regular, unlabored respirations on room air GI: Abdomen soft and nontender   NEURO:  Mental Status: Alert and oriented to self, states age as 1, states we are at [facility] Speech/Language: speech is dysarthric  Mild aphasia with paraphasic errors  Cranial Nerves:  II: PERRL. Visual fields full.  III, IV, VI: EOMI. Eyelids elevate symmetrically.  V: Sensation is intact to light touch and symmetrical to face.  VII: Right facial droop VIII: hearing intact to voice. IX, X: Palate elevates symmetrically. Phonation is normal.  QI:[id number] shrug 5/5. XII: tongue is deviated to the right  Motor:  RUE - 4/5 with drift LUE 5/5 RLE - 5/5  LLE 5/5 Tone: is normal and bulk is normal Sensation- Intact to light touch bilaterally. Extinction absent to light touch to DSS.   Coordination: FTN intact bilaterally, HKS: no ataxia in BLE.No drift.  Right side slow with RAM, no ataxia Gait- deferred  ASSESSMENT/PLAN  Acute Ischemic Infarct:  Left thalamic infarct s/p TNK, etiology:  small vessel disease  Code Stroke CT head No acute abnormality.  CTA head & neck No LVO CT perfusion No infarct core detected. The area of decreased perfusion is in the right anterior frontal lobe and right occipital lobe, favored to be artifactual.  MRI  Acute infarct in the medial left thalamus.  CT repeat pending 2D Echo EF 45-50%, LV global hypokinesis with LV hypertrophy LDL 108 HgbA1c 6.5 UDS pending VTE prophylaxis - SCDs No antithrombotic prior to admission, now on No antithrombotic for 24 hours post TNK  Therapy recommendations:  Pending Disposition:  Pending   Hypertension Home meds:  hydrochlorothiazide Stable Blood Pressure Goal: BP less than 180/105  Long-term BP goal normotensive  Hyperlipidemia Allergy to Atorvastatin  LDL 108, goal <  70 Add crestor 20mg  Continue statin at discharge  Mild cardiomyopathy 2D Echo EF 45-50%, LV global hypokinesis with LV hypertrophy Outpatient follow-up with cardiology  Other Stroke Risk Factors Obesity, Body mass index is 35.86 kg/m., BMI >/= 30 associated with increased stroke risk, recommend weight loss, diet and exercise as appropriate  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 MD to update note as needed.   Elmer Picker, DNP, FNP-BC [facility] Pager: [phone]  ATTENDING NOTE: I reviewed above note and agree with the assessment and plan. Pt was seen and examined.   Wife at bedside.  Patient drowsy sleepy, lying in bed, but easily arousable, follows simple commands, intermittent paraphasic errors but able to name and repeat.  Visual fields full, no gaze palsy, right facial droop, right upper extremity pronator drift, left upper extremity and bilateral lower extremity 5/5.  Sensation symmetrical, no sensory neglect.  Finger-to-nose bilaterally intact, although slow on the right.  Gait not tested  Patient stroke on the left thalamus likely small vessel disease, status post TNK.  CT repeat 24 hours of TNK tonight.  EF 45 to 50%, with global hypokinesis and LVH, need outpatient follow-up.  On Crestor 20.  PT and OT pending.  For detailed assessment and plan, please refer to above/below as I have made changes wherever appropriate.   Marvel Plan, MD PhD Stroke Neurology 11/19/2022 1:54 PM  This patient is critically ill due to stroke status post TNK, mild cardiomyopathy and at significant risk of neurological worsening, death form recurrent stroke, hemorrhagic transformation, bleeding from TNK, heart failure. This patient's care requires constant monitoring of vital signs, hemodynamics, respiratory and cardiac  monitoring, review of multiple databases, neurological assessment, discussion with family, other specialists and medical decision making of high complexity. I spent 35 minutes of neurocritical care time in the care of this patient. I had long discussion with patient and wife at bedside, updated pt current condition, treatment plan and potential prognosis, and answered all the questions.  They expressed understanding and appreciation.      To contact Stroke Continuity provider, please refer to [email or web address]. After hours, contact General Neurology

## 2022-11-19 NOTE — Evaluation (Signed)
Occupational Therapy Evaluation Patient Details Name: Gabriel Barnett MRN: [id number] DOB: 19-Oct-1965 Today's Date: 11/19/2022   History of Present Illness Patient is a 57 yo male presenting to the ED with slurred speech, R sided facial droop and arm weakness on 11/18/22. 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 TNK administered same day.   Clinical Impression   Prior to this admission, patient was living with his wife, working in security, and driving. Patient was fully independent with ADLs and functional mobility. Currently, patient presenting with cognitive impairments, decreased balance in standing and sitting, and need for minimal increased assist in order to complete ADLs. Patient impulsive with all movement, and requires cues for pacing. Patient with inconsistent answers to orientation questions, stating he had "ovarian cancer" as his problem and with decreased STM. Patient attempts to hide his deficits with humor. OT recommending outpatient OT (Neuro focused) at discharge. Patient also informed he should not drive until cleared by Neurologist (wife present and in agreement). OT will follow acutely.       If plan is discharge home, recommend the following: A little help with walking and/or transfers;Direct supervision/assist for financial management;Direct supervision/assist for medications management;Assist for transportation;Supervision due to cognitive status;A little help with bathing/dressing/bathroom (initially)    Functional Status Assessment  Patient has had a recent decline in their functional status and demonstrates the ability to make significant improvements in function in a reasonable and predictable amount of time.  Equipment Recommendations  None recommended by OT    Recommendations for Other Services       Precautions / Restrictions Precautions Precautions: Fall Precaution Comments: Systolic BP no higher than 180 Restrictions Weight Bearing Restrictions: No       Mobility Bed Mobility Overal bed mobility: Modified Independent             General bed mobility comments: cues for impulsivity    Transfers Overall transfer level: Needs assistance Equipment used: None Transfers: Sit to/from Stand Sit to Stand: Contact guard assist           General transfer comment: Cues for impulsivity, see PT note for further assessment      Balance Overall balance assessment: Mild deficits observed, not formally tested                                         ADL either performed or assessed with clinical judgement   ADL Overall ADL's : Needs assistance/impaired Eating/Feeding: Set up;Sitting   Grooming: Set up;Wash/dry face;Wash/dry hands;Oral care;Standing   Upper Body Bathing: Set up;Sitting   Lower Body Bathing: Contact guard assist;Sitting/lateral leans;Sit to/from stand   Upper Body Dressing : Set up;Standing   Lower Body Dressing: Contact guard assist;Sit to/from stand;Sitting/lateral leans Lower Body Dressing Details (indicate cue type and reason): posterior lean when attempting to don R sock, but can correct Toilet Transfer: Contact guard assist;Ambulation   Toileting- Clothing Manipulation and Hygiene: Contact guard assist;Sitting/lateral lean;Sit to/from stand       Functional mobility during ADLs: Contact guard assist;Cueing for sequencing;Cueing for safety General ADL Comments: Patient presenting with cognitive impairments, decreased balance in standing and sitting, and need for minimal increased assist in order to complete ADLs. Patient impulsive with all movement, and requires cues for pacing. Patient with inconsistent answers to orientation questions, stating he had "ovarian cancer" as his problem and with decreased STM. Patient attempts to hide his deficits with  humor. OT recommending outpatient OT (Neuro focused) at discharge. Patient also informed he should not drive until cleared by Neurologist (wife  present and in agreement). OT will follow acutely.     Vision Baseline Vision/History: 1 Wears glasses Ability to See in Adequate Light: 0 Adequate Patient Visual Report: No change from baseline Vision Assessment?: Yes Eye Alignment: Within Functional Limits Ocular Range of Motion: Within Functional Limits Alignment/Gaze Preference: Within Defined Limits Tracking/Visual Pursuits: Able to track stimulus in all quads without difficulty Saccades: Decreased speed of saccadic movement Convergence: Within functional limits Visual Fields: No apparent deficits Additional Comments: When assessing visual field, patient frequently moving eyes to the L instead of maintaining forward gaze on a target despite cues     Perception Perception: Impaired Preception Impairment Details: Inattention/Neglect Perception-Other Comments: frequently bumping into objects on R   Praxis Praxis: [facility]       Pertinent Vitals/Pain Pain Assessment Pain Assessment: No/denies pain     Extremity/Trunk Assessment Upper Extremity Assessment Upper Extremity Assessment: Overall WFL for tasks assessed   Lower Extremity Assessment Lower Extremity Assessment: Defer to PT evaluation   Cervical / Trunk Assessment Cervical / Trunk Assessment: Normal   Communication Communication Communication: No apparent difficulties Cueing Techniques: Verbal cues   Cognition Arousal: Alert Behavior During Therapy: Impulsive Overall Cognitive Status: Impaired/Different from baseline Area of Impairment: Orientation, Attention, Memory, Following commands, Safety/judgement, Awareness, Problem solving                 Orientation Level: Time, Situation Current Attention Level: Sustained Memory: Decreased short-term memory, Decreased recall of precautions Following Commands: Follows multi-step commands inconsistently, Follows one step commands consistently Safety/Judgement: Decreased awareness of safety, Decreased awareness of  deficits Awareness: Emergent Problem Solving: Requires verbal cues General Comments: Patient impulsive with all movement, and requires cues for pacing. Patient with inconsistent answers to orientation questions, stating he had "ovarian cancer" as his problem and with decreased STM. Patient attempts to hide his deficits with humor.     General Comments  VSS    Exercises     Shoulder Instructions      Home Living Family/patient expects to be discharged to:: Private residence Living Arrangements: Spouse/significant other;Children Available Help at Discharge: Available 24 hours/day Type of Home: House Home Access: Stairs to enter [employer] of Steps: 3 Entrance Stairs-Rails: None Home Layout: One level     Bathroom Shower/Tub: Chief Strategy Officer: Standard     Home Equipment: None          Prior Functioning/Environment Prior Level of Function : Independent/Modified Independent;Working/employed;Driving             Mobility Comments: independent ADLs Comments: independent, works in Office manager, works out heavily        OT Problem List: Decreased activity tolerance;Impaired balance (sitting and/or standing);Decreased cognition;Decreased safety awareness      OT Treatment/Interventions: Self-care/ADL training;Therapeutic exercise;Neuromuscular education;Energy conservation;DME and/or AE instruction;Manual therapy;Therapeutic activities;Cognitive remediation/compensation;Visual/perceptual remediation/compensation;Patient/family education;Balance training    OT Goals(Current goals can be found in the care plan section) Acute Rehab OT Goals Patient Stated Goal: to get back to work OT Goal Formulation: With patient Time For Goal Achievement: 12/03/22 Potential to Achieve Goals: Good ADL Goals Pt Will Perform Lower Body Bathing: Independently;sitting/lateral leans;sit to/from stand Pt Will Perform Lower Body Dressing: Independently;sitting/lateral  leans;sit to/from stand Pt Will Transfer to Toilet: Independently;ambulating;regular height toilet Pt Will Perform Toileting - Clothing Manipulation and hygiene: Independently;sitting/lateral leans;sit to/from stand Additional ADL Goal #1: Patient will complete  pillbox assessment or medication management assessment without errors in order to return to prior level of function. Additional ADL Goal #2: Patient will complete 3-4 step trail making task without cues to recall or correct in order to increase overall upper level cognition.  OT Frequency: Min 1X/week    Co-evaluation              AM-PAC OT "6 Clicks" Daily Activity     Outcome Measure Help from another person eating meals?: A Little Help from another person taking care of personal grooming?: A Little Help from another person toileting, which includes using toliet, bedpan, or urinal?: A Little Help from another person bathing (including washing, rinsing, drying)?: A Little Help from another person to put on and taking off regular upper body clothing?: A Little Help from another person to put on and taking off regular lower body clothing?: A Little 6 Click Score: 18   End of Session Nurse Communication: Mobility status  Activity Tolerance: Patient tolerated treatment well Patient left: in chair;with call bell/phone within reach;with chair alarm set;with nursing/sitter in room;with family/visitor present  OT Visit Diagnosis: Unsteadiness on feet (R26.81);Other abnormalities of gait and mobility (R26.89);Other symptoms and signs involving cognitive function                Time: 1405-1440 OT Time Calculation (min): 35 min Charges:  OT General Charges $OT Visit: 1 Visit OT Evaluation $OT Eval Moderate Complexity: 1 Mod  Pollyann Glen E. Jordyn Hofacker, OTR/L Acute Rehabilitation Services [phone]   [name] 11/19/2022, 3:34 PM

## 2022-11-19 NOTE — TOC CM/SW Note (Signed)
Transition of Care [facility]) - Inpatient Brief Assessment   Patient Details  Name: Gabriel Barnett MRN: [id number] Date of Birth: 11/21/1965  [facility]) CM/SW Contact:    Mearl Latin, LCSW Phone Number: 11/19/2022, 4:40 PM   Clinical Narrative: Patient admitted from home with spouse. TOC following to assist in setting up outpatient therapy referral.    Transition of Care Asessment: Insurance and Status: Insurance coverage has been reviewed Patient has primary care physician: Yes Home environment has been reviewed: From home Prior level of function:: Independent Prior/Current Home Services: No current home services Social Determinants of Health Reivew: SDOH reviewed no interventions necessary Readmission risk has been reviewed: Yes Transition of care needs: transition of care needs identified, TOC will continue to follow

## 2022-11-19 NOTE — Evaluation (Signed)
Physical Therapy Evaluation Patient Details Name: Gabriel Barnett MRN: [id number] DOB: July 06, 1965 Today's Date: 11/19/2022  History of Present Illness  Patient is a 57 yo male presenting to the ED with slurred speech, R sided facial droop and arm weakness on 11/18/22. 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 TNK administered same day.  Clinical Impression  Pt admitted with/for s/s of stroke.  Pt mobilizes at a CG to light min assist level for gait without AD.  Pt currently limited functionally due to the problems listed below.  (see problems list.)  Pt will benefit from PT to maximize function and safety to be able to get home safely with available assist.         If plan is discharge home, recommend the following: Assistance with cooking/housework;Assist for transportation   Can travel by private vehicle        Equipment Recommendations None recommended by PT  Recommendations for Other Services       Functional Status Assessment Patient has had a recent decline in their functional status and demonstrates the ability to make significant improvements in function in a reasonable and predictable amount of time.     Precautions / Restrictions Precautions Precautions: Fall Precaution Comments: Systolic BP no higher than 180 Restrictions Weight Bearing Restrictions: No      Mobility  Bed Mobility Overal bed mobility: Modified Independent             General bed mobility comments: cues for impulsivity    Transfers Overall transfer level: Needs assistance Equipment used: None Transfers: Sit to/from Stand Sit to Stand: Contact guard assist           General transfer comment: Cues for impulsivity, see PT note for further assessment    Ambulation/Gait Ambulation/Gait assistance: Min assist Gait Distance (Feet): 300 Feet Assistive device: None Gait Pattern/deviations: Step-through pattern   Gait velocity interpretation: 1.31 - 2.62 ft/sec, indicative of  limited community ambulator   General Gait Details: Overall mildly unsteady, but with deviations and not overt LOB.  With scanning, mildly worse, looking down caused pt to drift right noticeably.  Pt also not able to increase speed significantly today.  Stairs            Wheelchair Mobility     Tilt Bed    Modified Rankin (Stroke Patients Only) Modified Rankin (Stroke Patients Only) Pre-Morbid Rankin Score: No symptoms Modified Rankin: Moderately severe disability     Balance Overall balance assessment: Mild deficits observed, not formally tested                                           Pertinent Vitals/Pain      Home Living Family/patient expects to be discharged to:: Private residence Living Arrangements: Spouse/significant other;Children Available Help at Discharge: Available 24 hours/day Type of Home: House Home Access: Stairs to enter Entrance Stairs-Rails: None Entrance Stairs-Number of Steps: 3   Home Layout: One level Home Equipment: None      Prior Function Prior Level of Function : Independent/Modified Independent;Working/employed;Driving             Mobility Comments: independent ADLs Comments: independent, works in Office manager, works out heavily     Extremity/Trunk Assessment   Upper Extremity Assessment Upper Extremity Assessment: Overall WFL for tasks assessed    Lower Extremity Assessment Lower Extremity Assessment: Overall WFL for tasks assessed  Cervical / Trunk Assessment Cervical / Trunk Assessment: Normal  Communication   Communication Communication: No apparent difficulties Cueing Techniques: Verbal cues  Cognition Arousal: Alert Behavior During Therapy: Impulsive Overall Cognitive Status: Impaired/Different from baseline Area of Impairment: Orientation, Attention, Memory, Following commands, Safety/judgement, Awareness, Problem solving                 Orientation Level: Time, Situation Current  Attention Level: Sustained Memory: Decreased short-term memory, Decreased recall of precautions Following Commands: Follows multi-step commands inconsistently, Follows one step commands consistently Safety/Judgement: Decreased awareness of safety, Decreased awareness of deficits Awareness: Emergent Problem Solving: Requires verbal cues General Comments: Patient impulsive with all movement, and requires cues for pacing. Patient with inconsistent answers to orientation questions, stating he had "ovarian cancer" as his problem and with decreased STM. Patient attempts to hide his deficits with humor.        General Comments General comments (skin integrity, edema, etc.): VSS    Exercises     Assessment/Plan    PT Assessment Patient needs continued PT services  PT Problem List Decreased balance;Decreased mobility;Decreased coordination       PT Treatment Interventions Gait training;Stair training;Functional mobility training;Therapeutic activities;Balance training;Neuromuscular re-education;Patient/family education    PT Goals (Current goals can be found in the Care Plan section)  Acute Rehab PT Goals Patient Stated Goal: home independent, work PT Goal Formulation: With patient Time For Goal Achievement: 11/26/22 Potential to Achieve Goals: Good    Frequency Min 1X/week     Co-evaluation               AM-PAC PT "6 Clicks" Mobility  Outcome Measure Help needed turning from your back to your side while in a flat bed without using bedrails?: A Little Help needed moving from lying on your back to sitting on the side of a flat bed without using bedrails?: A Little Help needed moving to and from a bed to a chair (including a wheelchair)?: A Little Help needed standing up from a chair using your arms (e.g., wheelchair or bedside chair)?: A Little Help needed to walk in hospital room?: A Little Help needed climbing 3-5 steps with a railing? : A Little 6 Click Score: 18     End of Session   Activity Tolerance: Patient tolerated treatment well Patient left: with call bell/phone within reach;Other (comment) (Left with OT) Nurse Communication: Mobility status PT Visit Diagnosis: Unsteadiness on feet (R26.81);Other symptoms and signs involving the nervous system (R29.898)    Time: 8119-1478 PT Time Calculation (min) (ACUTE ONLY): 22 min   Charges:   PT Evaluation $PT Eval Low Complexity: 1 Low   PT General Charges $$ ACUTE PT VISIT: 1 Visit         11/19/2022  Jacinto Halim., PT Acute Rehabilitation Services [phone]  (office)  Gabriel Barnett 11/19/2022, 4:44 PM

## 2022-11-19 NOTE — Progress Notes (Signed)
Echocardiogram 2D Echocardiogram has been performed.  Warren Lacy Adyen Bifulco RDCS 11/19/2022, 8:29 AM

## 2022-11-19 NOTE — Progress Notes (Signed)
OT Cancellation Note  Patient Details Name: Gabriel Barnett MRN: [id number] DOB: 20-Nov-1965   Cancelled Treatment:    Reason Eval/Treat Not Completed: Patient not medically ready Patient receiving TNK at 2320 yesterday evening (11/10). Per acute rehab guidelines, patient to be on 24 hours bedrest prior to OT eval with TNK administration. OT will follow back when medically appropriate.   [name], OTR/L Acute Rehabilitation Services [phone]    [name] 11/19/2022, 7:58 AM

## 2022-11-19 NOTE — H&P (Signed)
NEUROLOGY H&P NOTE   Date of service: November 19, 2022 Patient Name: Gabriel Barnett MRN:  [id number] DOB:  [birth year] Chief Complaint: "R sided weakness"  History of Present Illness  Edwin Nethercott is a 57 y.o. male with hx of HTN, HLD who presents with R sided weakness, slurred speech, aphasia/encephalopathy and somnolence.  Symptoms were abrupt in onset per wife. Wife tells me that he was sitting when he developed sudden onset confusion and was seeing spots in his vision with slurring of speech. This was at 2115. EMS called and he was sitting on the floor, slurred speech, not making sense and not miving his R side. He was brought in as a code stroke.  Last known well: 2115 on 11/18/22. Modified rankin score: 0-Completely asymptomatic and back to baseline post- stroke tNKASE: discussed risks and benefits with patient's wife over phone. There was some delay as her phone's battery died but she was in the ED and she consented to tnkase after discussing risks and benefits. Thrombectomy: not offered 2/2 no LVO NIHSS components Score: Comment  1a Level of Conscious 0[]  1[x]  2[]  3[]      1b LOC Questions 0[]  1[]  2[x]       1c LOC Commands 0[x]  1[]  2[]       2 Best Gaze 0[]  1[x]  2[]     L gaze preference  3 Visual 0[]  1[]  2[x]  3[]      4 Facial Palsy 0[]  1[]  2[x]  3[]      5a Motor Arm - left 0[x]  1[]  2[]  3[]  4[]  UN[]    5b Motor Arm - Right 0[]  1[]  2[]  3[x]  4[]  UN[]    6a Motor Leg - Left 0[x]  1[]  2[]  3[]  4[]  UN[]    6b Motor Leg - Right 0[x]  1[]  2[]  3[]  4[]  UN[]    7 Limb Ataxia 0[x]  1[]  2[]  3[]  UN[]     8 Sensory 0[]  1[x]  2[]  UN[]      9 Best Language 0[]  1[]  2[x]  3[]      10 Dysarthria 0[]  1[]  2[x]  UN[]      11 Extinct. and Inattention 0[x]  1[]  2[]       TOTAL: 16      ROS   Unable to obtain review of system secondary to aphasia and confusion/somnolence  Past History   Past Medical History:  Diagnosis Date   Hyperlipidemia    Hypertension    Past Surgical History:  Procedure Laterality  Date   FOOT SURGERY Right    foot and ankle   HAND SURGERY Right    NOSE SURGERY     TRICEPS TENDON REPAIR     Family History  Problem Relation Age of Onset   Diabetes type II Other    Social History   Socioeconomic History   Marital status: Married    Spouse name: Not on file   Number of children: Not on file   Years of education: Not on file   Highest education level: Not on file  Occupational History   Not on file  Tobacco Use   Smoking status: Never   Smokeless tobacco: Never  Vaping Use   Vaping status: Never Used  Substance and Sexual Activity   Alcohol use: Never   Drug use: Never   Sexual activity: Not on file  Other Topics Concern   Not on file  Social History Narrative   Not on file   Social Determinants of Health   Financial Resource Strain: Not on file  Food Insecurity: Low Risk  (08/24/2022)   Received from [facility]   Hunger Vital  Sign    Worried About Programme researcher, broadcasting/film/video in the Last Year: Never true    Ran Out of Food in the Last Year: Never true  Transportation Needs: Not on file (08/24/2022)  Physical Activity: Not on file  Stress: Not on file  Social Connections: Not on file   Allergies  Allergen Reactions   Atorvastatin Other (See Comments)    Other Reaction(s): joint stiffness    Medications   Medications Prior to Admission  Medication Sig Dispense Refill Last Dose   finasteride (PROSCAR) 5 MG tablet Take 5 mg by mouth daily.   11/18/2022 at am   hydrochlorothiazide (HYDRODIURIL) 25 MG tablet Take 25 mg by mouth daily.   11/18/2022 at am   Multiple Vitamin (MULTIVITAMIN WITH MINERALS) TABS tablet Take 1 tablet by mouth daily.   11/18/2022 at am   omega-3 acid ethyl esters (LOVAZA) 1 g capsule Take 1 capsule by mouth daily.   11/18/2022   Testosterone Cypionate (TESTONE CIK) 200 MG/ML KIT Inject 100 mg into the muscle every 14 (fourteen) days.   Past Week     Vitals   Vitals:   11/18/22 2321 11/18/22 2330 11/18/22 2340 11/18/22  2355  BP: (!) 150/88 (!) 151/102 (!) 139/97 (!) 155/98  Pulse: 72 69    Resp: 19 20    Temp: 97.9 F (36.6 C)     TempSrc: Oral     SpO2: 96% 96%    Weight:      Height:         Body mass index is 35.86 kg/m.  Physical Exam   General: Laying comfortably in bed; in no acute distress.  HENT: Normal oropharynx and mucosa. Normal external appearance of ears and nose.  Neck: Supple, no pain or tenderness  CV: No JVD. No peripheral edema.  Pulmonary: Symmetric Chest rise. Normal respiratory effort.  Abdomen: Soft to touch, non-tender.  Ext: No cyanosis, edema, or deformity  Skin: No rash. Normal palpation of skin.   Musculoskeletal: Normal digits and nails by inspection. No clubbing.   Neurologic Examination  Mental status/Cognition: drowsy, oriented to self, but not making much sense. Speech/language: Fluent, garbled speech with word salad. comprehension intact to simple commands. Cranial nerves:   CN II Pupils equal and reactive to light, R hemianopsia vs visual neglect   CN III,IV,VI L gaze preference   CN V normal sensation in V1, V2, and V3 segments bilaterally   CN VII R facial droop   CN VIII normal hearing to speech   CN IX & X normal palatal elevation, no uvular deviation   CN XI 5/5 head turn and 5/5 shoulder shrug bilaterally   CN XII midline tongue protrusion   Motor:  Muscle bulk: normal, tone normal Mvmt Root Nerve  Muscle Right Left Comments  SA C5/6 Ax Deltoid 3 5   EF C5/6 Mc Biceps 2 5   EE C6/7/8 Rad Triceps 2 5   WF C6/7 Med FCR     WE C7/8 PIN ECU     F Ab C8/T1 U ADM/FDI 2 5   HF L1/2/3 Fem Illopsoas 5 5   KE L2/3/4 Fem Quad 5 5   DF L4/5 D Peron Tib Ant 5 5   PF S1/2 Tibial Grc/Sol 5 5    Sensation:  Light touch Decreased in R arm   Pin prick    Temperature    Vibration   Proprioception    Coordination/Complex Motor:  - Finger to Nose intact on  the left - Heel to shin unable to assess - Rapid alternating movement intact on the left -  Gait: deferred.  Labs   CBC:  Recent Labs  Lab 11/18/22 2210 11/18/22 2215  WBC 4.2  --   NEUTROABS 1.9  --   HGB 15.8 17.0  HCT 50.2 50.0  MCV 87.8  --   PLT 173  --     Basic Metabolic Panel:  Lab Results  Component Value Date   NA 141 11/18/2022   K 3.5 11/18/2022   CO2 24 11/18/2022   GLUCOSE 130 (H) 11/18/2022   BUN 13 11/18/2022   CREATININE 1.30 (H) 11/18/2022   CALCIUM 9.2 11/18/2022   GFRNONAA >60 11/18/2022   GFRAA 68 (L) 12/27/2011   Lipid Panel: No results found for: "LDLCALC" HgbA1c: No results found for: "HGBA1C" Urine Drug Screen: No results found for: "LABOPIA", "COCAINSCRNUR", "LABBENZ", "AMPHETMU", "THCU", "LABBARB"  Alcohol Level     Component Value Date/Time   ETH <10 11/18/2022 2210   INR  Lab Results  Component Value Date   INR 1.0 11/18/2022   APTT  Lab Results  Component Value Date   APTT 25 11/18/2022     CT Head without contrast(Personally reviewed): CTH was negative for a large hypodensity concerning for a large territory infarct or hyperdensity concerning for an ICH  CT angio Head and Neck with contrast(Personally reviewed): No LVO  MRI Brain(Personally reviewed): Acute L thalamic stroke  Impression   Isao Garry is a 57 y.o. male with hx of HTN, HLD who presents with R sided weakness, slurred speech, aphasia/encephalopathy and somnolence. Symptoms concerning for acute stroke. He was given tnkase. Thrombectomy was not offered 2/2 no LVO.  Subsequent MRI brain demonstrated medial left thalamic infarct. Suspect small vessel.  Recommendations  - Frequent NeuroChecks for post tNK care per stroke unit protocol: - Initial CTH demonstrated no acute hemorrhage or mass - MRI Brain - L thalamic stroke - CTA - no LVO - CT Perfusion - no mismatch - TTE - pending. - Lipid Panel: LDL - pending  - Statin: allergic to statin - HbA1c: pending - Antithrombotic: Start ASA 81 mg daily if 24 h CTH does not show acute hemorrhage -  DVT prophylaxis: SCDs. Pharmacologic prophylaxis if 24 h CTH does not demonstrate acute hemorrhage - Systolic Blood Pressure goal: < 180 mm Hg - Telemetry monitoring for arrhythmia: 72 hours - Swallow screen - ordered - PT/OT/SLP consults  ______________________________________________________________________  Code status discussed with patient's wife and patient is full code. Discussed and updated allergies in the chart.  This patient is critically ill and at significant risk of neurological worsening, death and care requires constant monitoring of vital signs, hemodynamics,respiratory and cardiac monitoring, neurological assessment, discussion with family, other specialists and medical decision making of high complexity. I spent 70 minutes of neurocritical care time  in the care of  this patient. This was time spent independent of any time provided by nurse practitioner or PA.  Erick Blinks [facility] 11/19/2022  12:56 AM   Signed,  Erick Blinks [facility]

## 2022-11-20 DIAGNOSIS — I6381 Other cerebral infarction due to occlusion or stenosis of small artery: Secondary | ICD-10-CM | POA: Diagnosis not present

## 2022-11-20 MED ORDER — HYDROCHLOROTHIAZIDE 25 MG PO TABS
25.0000 mg | ORAL_TABLET | Freq: Every day | ORAL | Status: DC
  Administered 2022-11-20 – 2022-11-21 (×2): 25 mg via ORAL
  Filled 2022-11-20 (×2): qty 1

## 2022-11-20 MED ORDER — ASPIRIN 81 MG PO TBEC
81.0000 mg | DELAYED_RELEASE_TABLET | Freq: Every day | ORAL | Status: DC
  Administered 2022-11-20 – 2022-11-21 (×2): 81 mg via ORAL
  Filled 2022-11-20 (×2): qty 1

## 2022-11-20 MED ORDER — CLOPIDOGREL BISULFATE 75 MG PO TABS
75.0000 mg | ORAL_TABLET | Freq: Every day | ORAL | Status: DC
  Administered 2022-11-20 – 2022-11-21 (×2): 75 mg via ORAL
  Filled 2022-11-20 (×2): qty 1

## 2022-11-20 MED ORDER — INFLUENZA VIRUS VACC SPLIT PF (FLUZONE) 0.5 ML IM SUSY
0.5000 mL | PREFILLED_SYRINGE | INTRAMUSCULAR | Status: AC
  Administered 2022-11-21: 0.5 mL via INTRAMUSCULAR
  Filled 2022-11-20: qty 0.5

## 2022-11-20 NOTE — Plan of Care (Signed)
  Problem: Education: Goal: Knowledge of disease or condition will improve Outcome: Progressing   Problem: Coping: Goal: Will identify appropriate support needs Outcome: Progressing   Problem: Health Behavior/Discharge Planning: Goal: Ability to manage health-related needs will improve Outcome: Progressing Goal: Goals will be collaboratively established with patient/family Outcome: Progressing   Problem: Self-Care: Goal: Ability to participate in self-care as condition permits will improve Outcome: Progressing Goal: Ability to communicate needs accurately will improve Outcome: Progressing   Problem: Nutrition: Goal: Dietary intake will improve Outcome: Progressing

## 2022-11-20 NOTE — Evaluation (Signed)
Speech Language Pathology Evaluation Patient Details Name: Gabriel Barnett MRN: [id number] DOB: 1965/12/17 Today's Date: 11/20/2022 Time: 1410-1433 SLP Time Calculation (min) (ACUTE ONLY): 23 min  Problem List:  Patient Active Problem List   Diagnosis Date Noted   Acute thalamic infarction (HCC) 11/18/2022   Past Medical History:  Past Medical History:  Diagnosis Date   Hyperlipidemia    Hypertension    Past Surgical History:  Past Surgical History:  Procedure Laterality Date   FOOT SURGERY Right    foot and ankle   HAND SURGERY Right    NOSE SURGERY     TRICEPS TENDON REPAIR     HPI:  Patient is a 57 y.o. male who presented on 11/11 with R sided weakness, slurred speech, aphasia/encephalopathy and somnolence. PMh is significant for HTN and HLD.   Assessment / Plan / Recommendation Clinical Impression  Patient seen by SLP for cognitive linguistic evaluation. he was asleep when SLP entered the room but was easily roused. His spouse was present and reported observations of deficits in the patient's memory (not remembering a family member's name) and processing speed. Cognitive-linguistic function was evaluated using components of the Cognistat assessment. Patient presents with mild-moderate cognitive linguistic impairment characterized by deficits in memory, word finding, language, processing speed, and reasoning. Relative strengths observed in repetition and attention tasks.  On a spaced retrieval tasks, patient recalled 0/4 objects. When describing a picture, patient's language was vague and utterances were 1-4 words in length (e.g. "tree," "boy falling," "bridge across the river."). Confrontation naming task was completed with 60% accuracy. Patient with decreased awareness, stating he believed his performance on the assessment to be the same as it would have been before this hospitalization. Patient would benefit from continued acute ST through this hospitalization. Upon discharge,  recommend patient receive OP ST.    SLP Assessment  SLP Recommendation/Assessment: Patient needs continued Speech Lanaguage Pathology Services SLP Visit Diagnosis: Cognitive communication deficit (R41.841);Aphasia (R47.01)    Recommendations for follow up therapy are one component of a multi-disciplinary discharge planning process, led by the attending physician.  Recommendations may be updated based on patient status, additional functional criteria and insurance authorization.    Follow Up Recommendations  Outpatient SLP    Assistance Recommended at Discharge  Intermittent Supervision/Assistance  Functional Status Assessment Patient has had a recent decline in their functional status and demonstrates the ability to make significant improvements in function in a reasonable and predictable amount of time.  Frequency and Duration min 2x/week  2 weeks      SLP Evaluation Cognition  Overall Cognitive Status: Impaired/Different from baseline Arousal/Alertness: Awake/alert Orientation Level: Oriented X4 Attention: Sustained Focused Attention: Appears intact Sustained Attention: Impaired Sustained Attention Impairment: Verbal basic;Functional basic Executive Function: Reasoning Reasoning: Impaired Reasoning Impairment: Verbal basic;Functional basic Safety/Judgment: Impaired       Comprehension  Auditory Comprehension Overall Auditory Comprehension: Appears within functional limits for tasks assessed Yes/No Questions: Not tested Commands: Within Functional Limits Conversation: Complex Visual Recognition/Discrimination Discrimination: Within Function Limits Reading Comprehension Reading Status: Not tested    Expression Expression Primary Mode of Expression: Verbal Verbal Expression Overall Verbal Expression: Impaired Initiation: No impairment Automatic Speech: Name Level of Generative/Spontaneous Verbalization: Conversation Repetition: No impairment Naming:  Impairment Responsive: 51-75% accurate Confrontation: Impaired Convergent: Not tested Divergent: Not tested Verbal Errors: Semantic paraphasias Pragmatics: No impairment Effective Techniques: Semantic cues;Phonemic cues;Articulatory cues Non-Verbal Means of Communication: Not applicable Written Expression Written Expression: Not tested   Oral / Motor  Oral Motor/Sensory Function Overall  Oral Motor/Sensory Function: Within functional limits Motor Speech Overall Motor Speech: Appears within functional limits for tasks assessed Respiration: Within functional limits Phonation: Normal Resonance: Within functional limits Articulation: Within functional limitis Intelligibility: Intelligible Motor Planning: Witnin functional limits Motor Speech Errors: Not applicable            Marline Backbone, Senaida Lange., Speech Therapy Student   11/20/2022, 3:59 PM

## 2022-11-20 NOTE — Progress Notes (Signed)
Physical Therapy Treatment Patient Details Name: Gabriel Barnett MRN: [id number] DOB: March 11, 1965 Today's Date: 11/20/2022   History of Present Illness Patient is a 57 yo male presenting to the ED with slurred speech, R sided facial droop and arm weakness on 11/18/22. 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 TNK administered same day.    PT Comments  Pt has progressed well toward goals, not at baseline, but likely 90% motor functioning and I suspect less for cognitive function.  Emphasis on age appropriate dynamic activity with need for quick decision making to change between tasks.     If plan is discharge home, recommend the following: Assistance with cooking/housework;Assist for transportation   Can travel by private vehicle        Equipment Recommendations  None recommended by PT    Recommendations for Other Services       Precautions / Restrictions Precautions Precautions:  (decreasing fall risk) Precaution Comments: Systolic BP no higher than 180 Restrictions Weight Bearing Restrictions: No     Mobility  Bed Mobility               General bed mobility comments: in recliner on arrival    Transfers Overall transfer level: Needs assistance Equipment used: None Transfers: Sit to/from Stand Sit to Stand: Supervision           General transfer comment: mild impulsivity continues    Ambulation/Gait Ambulation/Gait assistance: Supervision Gait Distance (Feet): 500 Feet Assistive device: None Gait Pattern/deviations: Step-through pattern Gait velocity: moderate Gait velocity interpretation: >2.62 ft/sec, indicative of community ambulatory   General Gait Details: Episodes of instability at age appropriate abrupt changes in direction, terminal speeds and stairs without the rail.  Noted mild processing slowness.   Stairs Stairs: Yes Stairs assistance: Supervision Stair Management: No rails, One rail Left, Alternating pattern, Forwards Number of  Stairs: 10 General stair comments: needs rail for safety, L LE still not optimally coordinated   Wheelchair Mobility     Tilt Bed    Modified Rankin (Stroke Patients Only) Modified Rankin (Stroke Patients Only) Pre-Morbid Rankin Score: No symptoms Modified Rankin: Moderate disability     Balance Overall balance assessment: Needs assistance Sitting-balance support: No upper extremity supported, Single extremity supported, Feet supported Sitting balance-Leahy Scale: Good     Standing balance support: No upper extremity supported, During functional activity Standing balance-Leahy Scale: Good                              Cognition Arousal: Alert Behavior During Therapy: Impulsive, WFL for tasks assessed/performed Overall Cognitive Status: Impaired/Different from baseline Area of Impairment: Problem solving                   Current Attention Level: Selective   Following Commands: Follows multi-step commands with increased time   Awareness: Emergent, Anticipatory Problem Solving: Slow processing          Exercises      General Comments General comments (skin integrity, edema, etc.): vss,  Educated on risk factors and BE FAST      Pertinent Vitals/Pain Pain Assessment Pain Assessment: Faces Faces Pain Scale: No hurt Pain Intervention(s): Monitored during session    Home Living                          Prior Function            PT Goals (current goals  can now be found in the care plan section) Acute Rehab PT Goals PT Goal Formulation: With patient Time For Goal Achievement: 11/26/22 Potential to Achieve Goals: Good Progress towards PT goals: Progressing toward goals    Frequency    Min 1X/week      PT Plan      Co-evaluation              AM-PAC PT "6 Clicks" Mobility   Outcome Measure  Help needed turning from your back to your side while in a flat bed without using bedrails?: A Little Help needed moving  from lying on your back to sitting on the side of a flat bed without using bedrails?: A Little Help needed moving to and from a bed to a chair (including a wheelchair)?: A Little Help needed standing up from a chair using your arms (e.g., wheelchair or bedside chair)?: A Little Help needed to walk in hospital room?: A Little Help needed climbing 3-5 steps with a railing? : A Little 6 Click Score: 18    End of Session     Patient left: with call bell/phone within reach;Other (comment) Nurse Communication: Mobility status PT Visit Diagnosis: Unsteadiness on feet (R26.81);Other symptoms and signs involving the nervous system (R29.898)     Time: 2706-2376 PT Time Calculation (min) (ACUTE ONLY): 37 min  Charges:    $Gait Training: 8-22 mins $Neuromuscular Re-education: 8-22 mins PT General Charges $$ ACUTE PT VISIT: 1 Visit                     11/20/2022  Jacinto Halim., PT Acute Rehabilitation Services [phone]  (office)   Eliseo Gum Takyra Cantrall 11/20/2022, 11:21 AM

## 2022-11-20 NOTE — Progress Notes (Addendum)
STROKE TEAM PROGRESS NOTE   BRIEF HPI Mr. Gabriel Barnett is a 57 y.o. male with history of HTN, HLD who presents with R sided weakness, slurred speech, aphasia/encephalopathy and somnolence. NIHSS 16. MRI shows left thalamic infarct.   SIGNIFICANT HOSPITAL EVENTS 11/10- Received TNK at 2320  INTERIM HISTORY/SUBJECTIVE CT Head tonight at 2200. NIHSS 2 for dysarthria and mild aphasia Vital signs stable, BP goal less than 180/105 ASA and Plavix ordered   OBJECTIVE  CBC    Component Value Date/Time   WBC 4.2 11/18/2022 2210   RBC 5.72 11/18/2022 2210   HGB 17.0 11/18/2022 2215   HCT 50.0 11/18/2022 2215   PLT 173 11/18/2022 2210   MCV 87.8 11/18/2022 2210   MCH 27.6 11/18/2022 2210   MCHC 31.5 11/18/2022 2210   RDW 14.6 11/18/2022 2210   LYMPHSABS 1.7 11/18/2022 2210   MONOABS 0.5 11/18/2022 2210   EOSABS 0.1 11/18/2022 2210   BASOSABS 0.0 11/18/2022 2210    BMET    Component Value Date/Time   NA 141 11/18/2022 2215   K 3.5 11/18/2022 2215   CL 103 11/18/2022 2215   CO2 24 11/18/2022 2210   GLUCOSE 130 (H) 11/18/2022 2215   BUN 13 11/18/2022 2215   CREATININE 1.30 (H) 11/18/2022 2215   CALCIUM 9.2 11/18/2022 2210   GFRNONAA >60 11/18/2022 2210    IMAGING past 24 hours CT HEAD WO CONTRAST ( )  Result Date: 11/20/2022 CLINICAL DATA:  Follow-up examination for stroke. EXAM: CT HEAD WITHOUT CONTRAST TECHNIQUE: Contiguous axial images were obtained from the base of the skull through the vertex without intravenous contrast. RADIATION DOSE REDUCTION: This exam was performed according to the departmental dose-optimization program which includes automated exposure control, adjustment of the mA and/or kV according to patient size and/or use of iterative reconstruction technique. COMPARISON:  Prior studies from 11/18/2022. FINDINGS: Brain: Cerebral volume within normal limits. Previously identified left thalamic infarct again seen, relatively stable in size from prior MRI. No  evidence for hemorrhagic transformation or significant regional mass effect. No other acute intracranial hemorrhage or large vessel territory infarct. Chronic right cerebellar infarct again noted. No mass lesion or midline shift. No hydrocephalus or extra-axial fluid collection. Vascular: No abnormal hyperdense vessel. Skull: Scalp soft tissues and calvarium demonstrate no new finding. Sinuses/Orbits: Globes and orbital soft tissues within normal limits. Paranasal sinuses mastoid air cells are largely clear. Other: None. IMPRESSION: 1. Continued interval evolution of previously identified left thalamic infarct, stable in size from prior MRI. No evidence for hemorrhagic transformation or significant regional mass effect. 2. No other new acute intracranial abnormality. 3. Chronic right cerebellar infarct. Electronically Signed   By: Rise Mu M.D.   On: 11/20/2022 05:30   ECHOCARDIOGRAM COMPLETE BUBBLE STUDY  Result Date: 11/19/2022    ECHOCARDIOGRAM REPORT   Patient Name:   Gabriel Barnett Date of Exam: 11/19/2022 Medical Rec #:  [id number]      Height:       73.5 in Accession #:    [id number]     Weight:       275.6 lb Date of Birth:  [birth year]      BSA:          2.478 m Patient Age:    57 years       BP:           124/76 mmHg Patient Gender: M              HR:  70 bpm. Exam Location:  Inpatient Procedure: 2D Echo, Color Doppler, Cardiac Doppler and Saline Contrast Bubble            Study Indications:    Stroke i63.9  History:        Patient has no prior history of Echocardiogram examinations.                 Risk Factors:Hypertension and Dyslipidemia.  Sonographer:    Irving Burton Senior RDCS Referring Phys: [id number] [facility] IMPRESSIONS  1. Left ventricular ejection fraction, by estimation, is 45 to 50%. The left ventricle has mildly decreased function. The left ventricle demonstrates global hypokinesis. There is mild concentric left ventricular hypertrophy. Left ventricular diastolic  parameters are consistent with Grade I diastolic dysfunction (impaired relaxation).  2. Right ventricular systolic function is mildly reduced. The right ventricular size is mildly enlarged. There is normal pulmonary artery systolic pressure. The estimated right ventricular systolic pressure is 21.2 mmHg.  3. Right atrial size was mildly dilated.  4. The mitral valve is normal in structure. Trivial mitral valve regurgitation. No evidence of mitral stenosis.  5. The aortic valve is tricuspid. Aortic valve regurgitation is not visualized. No aortic stenosis is present.  6. The inferior vena cava is normal in size with <50% respiratory variability, suggesting right atrial pressure of 8 mmHg.  7. Agitated saline contrast bubble study was negative, with no evidence of any interatrial shunt. FINDINGS  Left Ventricle: Left ventricular ejection fraction, by estimation, is 45 to 50%. The left ventricle has mildly decreased function. The left ventricle demonstrates global hypokinesis. The left ventricular internal cavity size was normal in size. There is  mild concentric left ventricular hypertrophy. Left ventricular diastolic parameters are consistent with Grade I diastolic dysfunction (impaired relaxation). Right Ventricle: The right ventricular size is mildly enlarged. No increase in right ventricular wall thickness. Right ventricular systolic function is mildly reduced. There is normal pulmonary artery systolic pressure. The tricuspid regurgitant velocity  is 1.82 m/s, and with an assumed right atrial pressure of 8 mmHg, the estimated right ventricular systolic pressure is 21.2 mmHg. Left Atrium: Left atrial size was normal in size. Right Atrium: Right atrial size was mildly dilated. Pericardium: There is no evidence of pericardial effusion. Mitral Valve: The mitral valve is normal in structure. Mild mitral annular calcification. Trivial mitral valve regurgitation. No evidence of mitral valve stenosis. Tricuspid Valve: The  tricuspid valve is normal in structure. Tricuspid valve regurgitation is trivial. Aortic Valve: The aortic valve is tricuspid. Aortic valve regurgitation is not visualized. No aortic stenosis is present. Pulmonic Valve: The pulmonic valve was normal in structure. Pulmonic valve regurgitation is trivial. Aorta: The aortic root and ascending aorta are structurally normal, with no evidence of dilitation. Venous: The inferior vena cava is normal in size with less than 50% respiratory variability, suggesting right atrial pressure of 8 mmHg. IAS/Shunts: No atrial level shunt detected by color flow Doppler. Agitated saline contrast was given intravenously to evaluate for intracardiac shunting. Agitated saline contrast bubble study was negative, with no evidence of any interatrial shunt.  LEFT VENTRICLE PLAX 2D LVIDd:         4.80 cm   Diastology LVIDs:         3.50 cm   LV e' medial:    5.77 cm/s LV PW:         1.20 cm   LV E/e' medial:  6.0 LV IVS:        1.20 cm   LV  e' lateral:   7.29 cm/s LVOT diam:     2.50 cm   LV E/e' lateral: 4.8 LV SV:         82 LV SV Index:   33 LVOT Area:     4.91 cm  RIGHT VENTRICLE RV S prime:     12.10 cm/s TAPSE (M-mode): 2.1 cm LEFT ATRIUM             Index        RIGHT ATRIUM           Index LA diam:        3.20 cm 1.29 cm/m   RA Area:     23.60 cm LA Vol (A2C):   51.8 ml 20.90 ml/m  RA Volume:   75.40 ml  30.43 ml/m LA Vol (A4C):   46.8 ml 18.88 ml/m LA Biplane Vol: 51.1 ml 20.62 ml/m  AORTIC VALVE LVOT Vmax:   86.20 cm/s LVOT Vmean:  64.100 cm/s LVOT VTI:    0.167 m  AORTA Ao Root diam: 3.70 cm Ao Asc diam:  3.30 cm MITRAL VALVE               TRICUSPID VALVE MV Area (PHT): 2.20 cm    TR Peak grad:   13.2 mmHg MV Decel Time: 345 msec    TR Vmax:        182.00 cm/s MV E velocity: 34.70 cm/s MV A velocity: 43.70 cm/s  SHUNTS MV E/A ratio:  0.79        Systemic VTI:  0.17 m                            Systemic Diam: 2.50 cm Dalton McleanMD Electronically signed by Wilfred Lacy  Signature Date/Time: 11/19/2022/9:27:58 AM    Final     Vitals:   11/20/22 0500 11/20/22 0600 11/20/22 0700 11/20/22 0800  BP: (!) 141/80 (!) 152/80 132/71   Pulse: 67 64 67   Resp: 19 17 (!) 21   Temp:    97.6 F (36.4 C)  TempSrc:    Oral  SpO2: 98% 96% 98%   Weight:      Height:         PHYSICAL EXAM General:  Alert, well-nourished, well-developed patient in no acute distress Psych:  Mood and affect appropriate for situation CV: Regular rate and rhythm on monitor Respiratory:  Regular, unlabored respirations on room air GI: Abdomen soft and nontender   NEURO:  Mental Status: Alert and oriented x4 Speech/Language: speech is mildly dysarthric  Mild aphasia with paraphasic errors - improving from yesterday   Cranial Nerves:  II: PERRL. Visual fields full.  III, IV, VI: EOMI. Eyelids elevate symmetrically.  V: Sensation is intact to light touch and symmetrical to face.  VII: Right facial droop VIII: hearing intact to voice. IX, X: Palate elevates symmetrically. Phonation is normal.  UX:[id number] shrug 5/5. XII: tongue is deviated to the right  Motor:  RUE - 5-/5   LUE 5/5 RLE - 5/5  LLE 5/5 Tone: is normal and bulk is normal Sensation- Intact to light touch bilaterally. Extinction absent to light touch to DSS.   Coordination: FTN intact bilaterally, HKS: no ataxia in BLE.No drift.  Right side slow with RAM, orbits  Gait- deferred  ASSESSMENT/PLAN  Stroke:  Left thalamic infarct s/p TNK, etiology:  small vessel disease  Code Stroke CT head No acute abnormality.  CTA head & neck No  LVO CT perfusion No infarct core detected. The area of decreased perfusion is in the right anterior frontal lobe and right occipital lobe, favored to be artifactual.  MRI  Acute infarct in the medial left thalamus.  CT repeat Continued interval evolution of previously identified left thalamic infarct. No evidence for hemorrhagic transformation or significant regional mass effect. 2D Echo  EF 45-50%, LV global hypokinesis with LV hypertrophy LDL 108 HgbA1c 6.5 UDS negative VTE prophylaxis - SCDs No antithrombotic prior to admission, now on ASA 81mg  and Plavix 75mg  for 3 weeks and then ASA 81mg  alone  Therapy recommendations: Outpatient PT OT Disposition: Pending  Hypertension Home meds:  hydrochlorothiazide Stable Blood Pressure Goal: BP less than 180/105  Long-term BP goal normotensive  Hyperlipidemia Allergy to Atorvastatin  LDL 108, goal < 70 Add crestor 20mg  Continue statin at discharge  Mild cardiomyopathy 2D Echo EF 45-50%, LV global hypokinesis with LV hypertrophy Outpatient follow-up with cardiology  Other Stroke Risk Factors Obesity, Body mass index is 35.86 kg/m., BMI >/= 30 associated with increased stroke risk, recommend weight loss, diet and exercise as appropriate  History of stroke on imaging - chronic right cerebellar small infarct  Other acute issues AKI versus CKD 3A, creatinine 1.3  Hospital day # 2  Patient seen and examined by NP/APP with MD. MD to update note as needed.   Elmer Picker, DNP, FNP-BC [facility] Pager: [phone]  ATTENDING NOTE: I reviewed above note and agree with the assessment and plan. Pt was seen and examined.   Wife and RN are at bedside.  Patient sitting in chair, more awake alert today than yesterday.  Still has intermittent paraphasic errors, however orientated x 3.  Slight right opportunity pronator drift and right facial droop.  CT repeat at 24 hours TNK showed no bleeding.  Started on DAPT, continue statin.  PT and OT recommend outpatient.  For detailed assessment and plan, please refer to above/below as I have made changes wherever appropriate.   Marvel Plan, MD PhD Stroke Neurology 11/20/2022 3:24 PM  This patient is critically ill due to stroke status post TNK, mild cardiomyopathy and at significant risk of neurological worsening, death form recurrent stroke, hemorrhagic  transformation, bleeding from TNK, heart failure. This patient's care requires constant monitoring of vital signs, hemodynamics, respiratory and cardiac monitoring, review of multiple databases, neurological assessment, discussion with family, other specialists and medical decision making of high complexity. I spent 35 minutes of neurocritical care time in the care of this patient. I had long discussion with patient and wife at bedside, updated pt current condition, treatment plan and potential prognosis, and answered all the questions.  They expressed understanding and appreciation.   To contact Stroke Continuity provider, please refer to [email or web address]. After hours, contact General Neurology

## 2022-11-20 NOTE — Progress Notes (Addendum)
Occupational Therapy Treatment Patient Details Name: Gabriel Barnett MRN: [id number] DOB: 1965/02/12 Today's Date: 11/20/2022   History of present illness Patient is a 57 yo male presenting to the ED with slurred speech, R sided facial droop and arm weakness on 11/18/22. 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 TNK administered same day.   OT comments  Patient participated in Medi-Cog with apparent cognitive deficits. Discussed with patient and family on need for direct supervision for medication management and patient not being able to drive till cleared by doctor. Discussed addressing problem solving tasks and processing. Acute OT to continue to follow to address cognition and self care. Follow up with OT at a neuro outpt center following discharge.      If plan is discharge home, recommend the following:  A little help with walking and/or transfers;Direct supervision/assist for financial management;Direct supervision/assist for medications management;Assist for transportation;Supervision due to cognitive status;A little help with bathing/dressing/bathroom   Equipment Recommendations  None recommended by OT    Recommendations for Other Services      Precautions / Restrictions Precautions Precautions: Fall Precaution Comments: Systolic BP no higher than 180 Restrictions Weight Bearing Restrictions: No       Mobility Bed Mobility Overal bed mobility: Modified Independent             General bed mobility comments: OOB in recliner    Transfers Overall transfer level: Needs assistance                 General transfer comment: Patient seated during session     Balance                                           ADL either performed or assessed with clinical judgement   ADL Overall ADL's : Needs assistance/impaired                                       General ADL Comments: focused on Medi-cog    Extremity/Trunk  Assessment              Vision       Perception     Praxis      Cognition Arousal: Alert   Overall Cognitive Status: Impaired/Different from baseline Area of Impairment: Orientation, Attention, Memory, Following commands, Safety/judgement, Awareness, Problem solving                 Orientation Level: Time, Situation Current Attention Level: Sustained Memory: Decreased short-term memory, Decreased recall of precautions Following Commands: Follows multi-step commands inconsistently, Follows one step commands consistently   Awareness: Emergent Problem Solving: Requires verbal cues General Comments: Performed the Medi-Cog with a score of 5/10        Exercises      Shoulder Instructions       General Comments patient and family education on medication management and problem solving    Pertinent Vitals/ Pain       Pain Assessment Pain Assessment: No/denies pain  Home Living                                          Prior Functioning/Environment  Frequency  Min 1X/week        Progress Toward Goals  OT Goals(current goals can now be found in the care plan section)  Progress towards OT goals: Progressing toward goals  Acute Rehab OT Goals Patient Stated Goal: get better OT Goal Formulation: With patient/family Time For Goal Achievement: 12/03/22 Potential to Achieve Goals: Good ADL Goals Pt Will Perform Lower Body Bathing: Independently;sitting/lateral leans;sit to/from stand Pt Will Perform Lower Body Dressing: Independently;sitting/lateral leans;sit to/from stand Pt Will Transfer to Toilet: Independently;ambulating;regular height toilet Pt Will Perform Toileting - Clothing Manipulation and hygiene: Independently;sitting/lateral leans;sit to/from stand Additional ADL Goal #1: Patient will complete pillbox assessment or medication management assessment without errors in order to return to prior level of  function. Additional ADL Goal #2: Patient will complete 3-4 step trail making task without cues to recall or correct in order to increase overall upper level cognition.  Plan      Co-evaluation                 AM-PAC OT "6 Clicks" Daily Activity     Outcome Measure   Help from another person eating meals?: A Little Help from another person taking care of personal grooming?: A Little Help from another person toileting, which includes using toliet, bedpan, or urinal?: A Little Help from another person bathing (including washing, rinsing, drying)?: A Little Help from another person to put on and taking off regular upper body clothing?: A Little Help from another person to put on and taking off regular lower body clothing?: A Little 6 Click Score: 18    End of Session    OT Visit Diagnosis: Unsteadiness on feet (R26.81);Other abnormalities of gait and mobility (R26.89);Other symptoms and signs involving cognitive function   Activity Tolerance Patient tolerated treatment well   Patient Left in chair;with call bell/phone within reach;with family/visitor present   Nurse Communication Mobility status        Time: 6962-9528 OT Time Calculation (min): 26 min  Charges: OT General Charges $OT Visit: 1 Visit OT Treatments $Therapeutic Activity: 23-37 mins  Alfonse Flavors, OTA Acute Rehabilitation Services  Office [phone]   Dewain Penning 11/20/2022, 11:24 AM

## 2022-11-21 ENCOUNTER — Other Ambulatory Visit (HOSPITAL_COMMUNITY): Payer: Self-pay

## 2022-11-21 DIAGNOSIS — I6381 Other cerebral infarction due to occlusion or stenosis of small artery: Secondary | ICD-10-CM | POA: Diagnosis not present

## 2022-11-21 LAB — BASIC METABOLIC PANEL
Anion gap: 8 (ref 5–15)
BUN: 9 mg/dL (ref 6–20)
CO2: 26 mmol/L (ref 22–32)
Calcium: 8.6 mg/dL — ABNORMAL LOW (ref 8.9–10.3)
Chloride: 101 mmol/L (ref 98–111)
Creatinine, Ser: 1.47 mg/dL — ABNORMAL HIGH (ref 0.61–1.24)
GFR, Estimated: 55 mL/min — ABNORMAL LOW (ref >60)
Glucose, Bld: 162 mg/dL — ABNORMAL HIGH (ref 70–99)
Potassium: 3.8 mmol/L (ref 3.5–5.1)
Sodium: 135 mmol/L (ref 135–145)

## 2022-11-21 LAB — CBC
HCT: 48.7 % (ref 39.0–52.0)
Hemoglobin: 15.3 g/dL (ref 13.0–17.0)
MCH: 27.2 pg (ref 26.0–34.0)
MCHC: 31.4 g/dL (ref 30.0–36.0)
MCV: 86.5 fL (ref 80.0–100.0)
Platelets: 158 10*3/uL (ref 150–400)
RBC: 5.63 MIL/uL (ref 4.22–5.81)
RDW: 14.6 % (ref 11.5–15.5)
WBC: 4.1 10*3/uL (ref 4.0–10.5)
nRBC: 0 % (ref 0.0–0.2)

## 2022-11-21 MED ORDER — ROSUVASTATIN CALCIUM 20 MG PO TABS
20.0000 mg | ORAL_TABLET | Freq: Every day | ORAL | 1 refills | Status: AC
  Filled 2022-11-21: qty 30, 30d supply, fill #0

## 2022-11-21 MED ORDER — CLOPIDOGREL BISULFATE 75 MG PO TABS
75.0000 mg | ORAL_TABLET | Freq: Every day | ORAL | 0 refills | Status: AC
  Filled 2022-11-21: qty 21, 21d supply, fill #0

## 2022-11-21 MED ORDER — ASPIRIN 81 MG PO TBEC
81.0000 mg | DELAYED_RELEASE_TABLET | Freq: Every day | ORAL | 12 refills | Status: AC
  Filled 2022-11-21: qty 30, 30d supply, fill #0

## 2022-11-21 NOTE — TOC Transition Note (Signed)
Transition of Care [facility]) - CM/SW Discharge Note   Patient Details  Name: Gabriel Barnett MRN: [id number] Date of Birth: 08-13-1965  [facility] CM/SW Contact:  Kermit Balo, RN Phone Number: 11/21/2022, 11:43 AM   Clinical Narrative:     Pt is from home with his wife and adult daughters. He will have 24 hour supervision. No DME at home.  Family to provide needed transportation and manage his medications. Outpatient arranged with [facility] Outpatient therapy. Information on the AVS. Spouse will call to arrange first appointment. Wife will transport home.   Final next level of care: OP Rehab Barriers to Discharge: No Barriers Identified   Patient Goals and CMS Choice      Discharge Placement                         Discharge Plan and Services Additional resources added to the After Visit Summary for                                       Social Determinants of Health (SDOH) Interventions SDOH Screenings   Food Insecurity: No Food Insecurity (11/20/2022)  Housing: Patient Declined (11/20/2022)  Transportation Needs: No Transportation Needs (11/20/2022)  Utilities: Not At Risk (11/20/2022)  Depression (PHQ2-9): Low Risk  (06/26/2018)  Tobacco Use: Low Risk  (11/18/2022)     Readmission Risk Interventions     No data to display

## 2022-11-21 NOTE — Plan of Care (Signed)
  Problem: Education: Goal: Knowledge of disease or condition will improve Outcome: Progressing Goal: Knowledge of secondary prevention will improve (MUST DOCUMENT ALL) Outcome: Progressing Goal: Knowledge of patient specific risk factors will improve Loraine Leriche N/A or DELETE if not current risk factor) Outcome: Progressing   Problem: Ischemic Stroke/TIA Tissue Perfusion: Goal: Complications of ischemic stroke/TIA will be minimized Outcome: Progressing   Problem: Coping: Goal: Will verbalize positive feelings about self Outcome: Progressing Goal: Will identify appropriate support needs Outcome: Progressing   Problem: Coping: Goal: Will identify appropriate support needs Outcome: Progressing   Problem: Health Behavior/Discharge Planning: Goal: Ability to manage health-related needs will improve Outcome: Progressing Goal: Goals will be collaboratively established with patient/family Outcome: Progressing   Problem: Self-Care: Goal: Ability to participate in self-care as condition permits will improve Outcome: Progressing Goal: Verbalization of feelings and concerns over difficulty with self-care will improve Outcome: Progressing Goal: Ability to communicate needs accurately will improve Outcome: Progressing   Problem: Nutrition: Goal: Risk of aspiration will decrease Outcome: Progressing Goal: Dietary intake will improve Outcome: Progressing   Problem: Education: Goal: Knowledge of General Education information will improve Description: Including pain rating scale, medication(s)/side effects and non-pharmacologic comfort measures Outcome: Progressing   Problem: Health Behavior/Discharge Planning: Goal: Ability to manage health-related needs will improve Outcome: Progressing   Problem: Clinical Measurements: Goal: Ability to maintain clinical measurements within normal limits will improve Outcome: Progressing Goal: Will remain free from infection Outcome:  Progressing Goal: Diagnostic test results will improve Outcome: Progressing Goal: Respiratory complications will improve Outcome: Progressing Goal: Cardiovascular complication will be avoided Outcome: Progressing   Problem: Activity: Goal: Risk for activity intolerance will decrease Outcome: Progressing   Problem: Nutrition: Goal: Adequate nutrition will be maintained Outcome: Progressing   Problem: Coping: Goal: Level of anxiety will decrease Outcome: Progressing   Problem: Elimination: Goal: Will not experience complications related to bowel motility Outcome: Progressing Goal: Will not experience complications related to urinary retention Outcome: Progressing   Problem: Pain Management: Goal: General experience of comfort will improve Outcome: Progressing   Problem: Safety: Goal: Ability to remain free from injury will improve Outcome: Progressing   Problem: Skin Integrity: Goal: Risk for impaired skin integrity will decrease Outcome: Progressing

## 2022-11-21 NOTE — Progress Notes (Signed)
Discharge instructions reviewed with pt and his family member.  Copy of instructions given to pt. [facility] TOC  Pharmacy has filled scripts for pt and meds will be picked up on the way out for discharge. Stroke education has already been done, pt has the stroke booklet and more stroke education handouts were given at this time. Pt and family know the signs and symptoms of a stroke and what to look for, and when to call 911.  Pt to be d/c'd via wheelchair with belongings, with family.           To be escorted by staff/hospital volunteer.   Nakayla Rorabaugh,RN SWOT

## 2022-11-21 NOTE — Discharge Instructions (Signed)
Gabriel Barnett, you came to the hospital with right sided weakness, slurred speech and difficulty speaking.  Your symptoms were due to a stroke, and you were given TNK to treat this.  You are now ready to go home with outpatient PT/OT/SLP.  You will need to take aspirin and Plavix for three weeks, followed by aspirin alone.  You will need to take Crestor for your cholesterol.  IF you have side effects from the Crestor, talk to your primary care provider about other options.  You will need to be seen in the stroke clinic in 6-8 weeks.

## 2022-11-21 NOTE — Discharge Summary (Addendum)
Stroke Discharge Summary  Patient ID: Gabriel Barnett   MRN: [id number]      DOB: 14-Feb-1965  Date of Admission: 11/18/2022 Date of Discharge: 11/21/2022  Attending Physician:  Stroke, Md, MD Consultant(s):    None  Patient's PCP:  Ileana Ladd, MD (Inactive)  DISCHARGE PRIMARY DIAGNOSIS:   Stroke:  Left thalamic infarct s/p TNK, etiology:  small vessel disease   Secondary Diagnoses: Hypertension Hyperlipidemia Mild cardiomyopathy  Obesity  AKI vs. CKD 3A  Allergies as of 11/21/2022       Reactions   Atorvastatin Other (See Comments)   Other Reaction(s): joint stiffness        Medication List     TAKE these medications    aspirin EC 81 MG tablet Take 1 tablet (81 mg total) by mouth daily. Swallow whole. Start taking on: November 22, 2022   clopidogrel 75 MG tablet Commonly known as: PLAVIX Take 1 tablet (75 mg total) by mouth daily. Start taking on: November 22, 2022   finasteride 5 MG tablet Commonly known as: PROSCAR Take 5 mg by mouth daily.   hydrochlorothiazide 25 MG tablet Commonly known as: HYDRODIURIL Take 25 mg by mouth daily.   multivitamin with minerals Tabs tablet Take 1 tablet by mouth daily.   omega-3 acid ethyl esters 1 g capsule Commonly known as: LOVAZA Take 1 capsule by mouth daily.   rosuvastatin 20 MG tablet Commonly known as: CRESTOR Take 1 tablet (20 mg total) by mouth daily. Start taking on: November 22, 2022   Testone CIK 200 MG/ML Kit Generic drug: Testosterone Cypionate Inject 100 mg into the muscle every 14 (fourteen) days.        LABORATORY STUDIES CBC    Component Value Date/Time   WBC 4.1 11/21/2022 0442   RBC 5.63 11/21/2022 0442   HGB 15.3 11/21/2022 0442   HCT 48.7 11/21/2022 0442   PLT 158 11/21/2022 0442   MCV 86.5 11/21/2022 0442   MCH 27.2 11/21/2022 0442   MCHC 31.4 11/21/2022 0442   RDW 14.6 11/21/2022 0442   LYMPHSABS 1.7 11/18/2022 2210   MONOABS 0.5 11/18/2022 2210   EOSABS 0.1  11/18/2022 2210   BASOSABS 0.0 11/18/2022 2210   CMP    Component Value Date/Time   NA 135 11/21/2022 0442   K 3.8 11/21/2022 0442   CL 101 11/21/2022 0442   CO2 26 11/21/2022 0442   GLUCOSE 162 (H) 11/21/2022 0442   BUN 9 11/21/2022 0442   CREATININE 1.47 (H) 11/21/2022 0442   CALCIUM 8.6 (L) 11/21/2022 0442   PROT 6.8 11/18/2022 2210   ALBUMIN 3.5 11/18/2022 2210   AST 35 11/18/2022 2210   ALT 34 11/18/2022 2210   ALKPHOS 42 11/18/2022 2210   BILITOT 0.8 11/18/2022 2210   GFRNONAA 55 (L) 11/21/2022 0442   GFRAA 68 (L) 12/27/2011 0922   COAGS Lab Results  Component Value Date   INR 1.0 11/18/2022   Lipid Panel    Component Value Date/Time   CHOL 162 11/19/2022 0656   TRIG 95 11/19/2022 0656   HDL 35 (L) 11/19/2022 0656   CHOLHDL 4.6 11/19/2022 0656   VLDL 19 11/19/2022 0656   LDLCALC 108 (H) 11/19/2022 0656   HgbA1C  Lab Results  Component Value Date   HGBA1C 6.5 (H) 11/19/2022   Urine Drug Screen negative Alcohol Level    Component Value Date/Time   ETH <10 11/18/2022 2210     SIGNIFICANT DIAGNOSTIC STUDIES CT HEAD WO CONTRAST (  )  Result Date: 11/20/2022 CLINICAL DATA:  Follow-up examination for stroke. EXAM: CT HEAD WITHOUT CONTRAST TECHNIQUE: Contiguous axial images were obtained from the base of the skull through the vertex without intravenous contrast. RADIATION DOSE REDUCTION: This exam was performed according to the departmental dose-optimization program which includes automated exposure control, adjustment of the mA and/or kV according to patient size and/or use of iterative reconstruction technique. COMPARISON:  Prior studies from 11/18/2022. FINDINGS: Brain: Cerebral volume within normal limits. Previously identified left thalamic infarct again seen, relatively stable in size from prior MRI. No evidence for hemorrhagic transformation or significant regional mass effect. No other acute intracranial hemorrhage or large vessel territory infarct.  Chronic right cerebellar infarct again noted. No mass lesion or midline shift. No hydrocephalus or extra-axial fluid collection. Vascular: No abnormal hyperdense vessel. Skull: Scalp soft tissues and calvarium demonstrate no new finding. Sinuses/Orbits: Globes and orbital soft tissues within normal limits. Paranasal sinuses mastoid air cells are largely clear. Other: None. IMPRESSION: 1. Continued interval evolution of previously identified left thalamic infarct, stable in size from prior MRI. No evidence for hemorrhagic transformation or significant regional mass effect. 2. No other new acute intracranial abnormality. 3. Chronic right cerebellar infarct. Electronically Signed   By: Rise Mu M.D.   On: 11/20/2022 05:30   ECHOCARDIOGRAM COMPLETE BUBBLE STUDY  Result Date: 11/19/2022    ECHOCARDIOGRAM REPORT   Patient Name:   Gabriel Barnett Date of Exam: 11/19/2022 Medical Rec #:  [id number]      Height:       73.5 in Accession #:    [id number]     Weight:       275.6 lb Date of Birth:  10-03-1965      BSA:          2.478 m Patient Age:    57 years       BP:           124/76 mmHg Patient Gender: M              HR:           70 bpm. Exam Location:  Inpatient Procedure: 2D Echo, Color Doppler, Cardiac Doppler and Saline Contrast Bubble            Study Indications:    Stroke i63.9  History:        Patient has no prior history of Echocardiogram examinations.                 Risk Factors:Hypertension and Dyslipidemia.  Sonographer:    Irving Burton Senior RDCS Referring Phys: [id number] [facility] IMPRESSIONS  1. Left ventricular ejection fraction, by estimation, is 45 to 50%. The left ventricle has mildly decreased function. The left ventricle demonstrates global hypokinesis. There is mild concentric left ventricular hypertrophy. Left ventricular diastolic parameters are consistent with Grade I diastolic dysfunction (impaired relaxation).  2. Right ventricular systolic function is mildly reduced. The right  ventricular size is mildly enlarged. There is normal pulmonary artery systolic pressure. The estimated right ventricular systolic pressure is 21.2 mmHg.  3. Right atrial size was mildly dilated.  4. The mitral valve is normal in structure. Trivial mitral valve regurgitation. No evidence of mitral stenosis.  5. The aortic valve is tricuspid. Aortic valve regurgitation is not visualized. No aortic stenosis is present.  6. The inferior vena cava is normal in size with <50% respiratory variability, suggesting right atrial pressure of 8 mmHg.  7. Agitated saline contrast bubble study was negative,  with no evidence of any interatrial shunt. FINDINGS  Left Ventricle: Left ventricular ejection fraction, by estimation, is 45 to 50%. The left ventricle has mildly decreased function. The left ventricle demonstrates global hypokinesis. The left ventricular internal cavity size was normal in size. There is  mild concentric left ventricular hypertrophy. Left ventricular diastolic parameters are consistent with Grade I diastolic dysfunction (impaired relaxation). Right Ventricle: The right ventricular size is mildly enlarged. No increase in right ventricular wall thickness. Right ventricular systolic function is mildly reduced. There is normal pulmonary artery systolic pressure. The tricuspid regurgitant velocity  is 1.82 m/s, and with an assumed right atrial pressure of 8 mmHg, the estimated right ventricular systolic pressure is 21.2 mmHg. Left Atrium: Left atrial size was normal in size. Right Atrium: Right atrial size was mildly dilated. Pericardium: There is no evidence of pericardial effusion. Mitral Valve: The mitral valve is normal in structure. Mild mitral annular calcification. Trivial mitral valve regurgitation. No evidence of mitral valve stenosis. Tricuspid Valve: The tricuspid valve is normal in structure. Tricuspid valve regurgitation is trivial. Aortic Valve: The aortic valve is tricuspid. Aortic valve  regurgitation is not visualized. No aortic stenosis is present. Pulmonic Valve: The pulmonic valve was normal in structure. Pulmonic valve regurgitation is trivial. Aorta: The aortic root and ascending aorta are structurally normal, with no evidence of dilitation. Venous: The inferior vena cava is normal in size with less than 50% respiratory variability, suggesting right atrial pressure of 8 mmHg. IAS/Shunts: No atrial level shunt detected by color flow Doppler. Agitated saline contrast was given intravenously to evaluate for intracardiac shunting. Agitated saline contrast bubble study was negative, with no evidence of any interatrial shunt.  LEFT VENTRICLE PLAX 2D LVIDd:         4.80 cm   Diastology LVIDs:         3.50 cm   LV e' medial:    5.77 cm/s LV PW:         1.20 cm   LV E/e' medial:  6.0 LV IVS:        1.20 cm   LV e' lateral:   7.29 cm/s LVOT diam:     2.50 cm   LV E/e' lateral: 4.8 LV SV:         82 LV SV Index:   33 LVOT Area:     4.91 cm  RIGHT VENTRICLE RV S prime:     12.10 cm/s TAPSE (M-mode): 2.1 cm LEFT ATRIUM             Index        RIGHT ATRIUM           Index LA diam:        3.20 cm 1.29 cm/m   RA Area:     23.60 cm LA Vol (A2C):   51.8 ml 20.90 ml/m  RA Volume:   75.40 ml  30.43 ml/m LA Vol (A4C):   46.8 ml 18.88 ml/m LA Biplane Vol: 51.1 ml 20.62 ml/m  AORTIC VALVE LVOT Vmax:   86.20 cm/s LVOT Vmean:  64.100 cm/s LVOT VTI:    0.167 m  AORTA Ao Root diam: 3.70 cm Ao Asc diam:  3.30 cm MITRAL VALVE               TRICUSPID VALVE MV Area (PHT): 2.20 cm    TR Peak grad:   13.2 mmHg MV Decel Time: 345 msec    TR Vmax:  182.00 cm/s MV E velocity: 34.70 cm/s MV A velocity: 43.70 cm/s  SHUNTS MV E/A ratio:  0.79        Systemic VTI:  0.17 m                            Systemic Diam: 2.50 cm Dalton McleanMD Electronically signed by Wilfred Lacy Signature Date/Time: 11/19/2022/9:27:58 AM    Final    MR BRAIN WO CONTRAST  Result Date: 11/18/2022 CLINICAL DATA:  Stroke suspected  EXAM: MRI HEAD WITHOUT CONTRAST TECHNIQUE: Multiplanar, multiecho pulse sequences of the brain and surrounding structures were obtained without intravenous contrast. COMPARISON:  No prior MRI available, correlation is made with CT head 11/18/2022 FINDINGS: Brain: Subtle restricted diffusion with ADC correlate in the medial left thalamus (series 5, image 79 and series 6, image 27), which is not associated with significantly increased T2 hyperintense signal, consistent with acute infarct. No acute hemorrhage, mass, mass effect, or midline shift. No hydrocephalus or extra-axial collection. Pituitary and craniocervical junction within normal limits. No hemosiderin deposition to suggest remote hemorrhage. Vascular: Normal arterial flow voids. Skull and upper cervical spine: Normal marrow signal. Sinuses/Orbits: Clear paranasal sinuses. No acute finding in the orbits. Other: The mastoid air cells are well aerated. 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 These findings were discussed on 11/18/2022 at 11:05 pm with provider [facility] . Electronically Signed   By: Wiliam Ke M.D.   On: 11/18/2022 23:12   CT CEREBRAL PERFUSION W CONTRAST  Result Date: 11/18/2022 CLINICAL DATA:  Acute ischemic left MCA stroke EXAM: CT PERFUSION BRAIN TECHNIQUE: Multiphase CT imaging of the brain was performed following IV bolus contrast injection. Subsequent parametric perfusion maps were calculated using RAPID software. RADIATION DOSE REDUCTION: This exam was performed according to the departmental dose-optimization program which includes automated exposure control, adjustment of the mA and/or kV according to patient size and/or use of iterative reconstruction technique. CONTRAST:  40mL OMNIPAQUE IOHEXOL 350 MG/ML SOLN COMPARISON:  No prior CT perfusion available, correlation is made with 11/18/2022 CT head FINDINGS: CT Brain Perfusion Findings: CBF (<30%) Volume: 0mL Perfusion (Tmax>6.0s) volume: 9mL Mismatch  Volume: 9mL ASPECTS on noncontrast CT Head: 10 at 10:27 p.m. today. Infarct Core: 0 mL Infarction Location:No infarct core detected. The area of decreased perfusion is in the right anterior frontal lobe and right occipital lobe, favored to be artifactual. IMPRESSION: No infarct core detected. The area of decreased perfusion is in the right anterior frontal lobe and right occipital lobe, favored to be artifactual. Attention on subsequent MRI. Electronically Signed   By: Wiliam Ke M.D.   On: 11/18/2022 22:47   CT ANGIO HEAD NECK W WO CM (CODE STROKE)  Result Date: 11/18/2022 CLINICAL DATA:  Left MCA territory deficits EXAM: CT ANGIOGRAPHY HEAD AND NECK TECHNIQUE: Multidetector CT imaging of the head and neck was performed using the standard protocol during bolus administration of intravenous contrast. Multiplanar CT image reconstructions and MIPs were obtained to evaluate the vascular anatomy. Carotid stenosis measurements (when applicable) are obtained utilizing NASCET criteria, using the distal internal carotid diameter as the denominator. RADIATION DOSE REDUCTION: This exam was performed according to the departmental dose-optimization program which includes automated exposure control, adjustment of the mA and/or kV according to patient size and/or use of iterative reconstruction technique. CONTRAST:  75mL OMNIPAQUE IOHEXOL 350 MG/ML SOLN COMPARISON:  No prior CTA or CT perfusion available, correlation is made with CT head 11/18/2022 FINDINGS:  CT HEAD FINDINGS For noncontrast findings, please see same day CT head. CTA NECK FINDINGS Aortic arch: Evaluation of the arch is limited by beam hardening artifact from the adjacent contrast bolus. Right carotid system: No evidence of dissection, occlusion, or hemodynamically significant stenosis (greater than 50%). Left carotid system: No evidence of dissection, occlusion, or hemodynamically significant stenosis (greater than 50%). Vertebral arteries: No evidence of  dissection, occlusion, or hemodynamically significant stenosis (greater than 50%). Skeleton: No acute osseous abnormality. Degenerative changes in the cervical spine. Other neck: No acute finding. Upper chest: No focal pulmonary opacity or pleural effusion. Review of the MIP images confirms the above findings CTA HEAD FINDINGS Anterior circulation: Both internal carotid arteries are patent to the termini, without significant stenosis. A1 segments patent. Normal anterior communicating artery. The right A2 and A3s are not definitively visualized, which may indicate occlusion versus an azygous A2 and A3. No M1 stenosis or occlusion. MCA branches perfused to their distal aspects without significant stenosis. Posterior circulation: Vertebral arteries patent to the vertebrobasilar junction without significant stenosis. Posterior inferior cerebellar arteries patent proximally. Basilar patent to its distal aspect without significant stenosis. Superior cerebellar arteries patent proximally. Quite diminutive P1 segments, with near fetal origin of the bilateral PCAs from the posterior communicating arteries. The PCAs are quite diminutive bilaterally, without focal stenosis. Venous sinuses: Not well opacified due to phase of timing. Anatomic variants: None significant. No evidence of aneurysm or vascular malformation. Review of the MIP images confirms the above findings IMPRESSION: 1. The right A2 and A3s are not definitively visualized, which may indicate occlusion versus an azygous A2 and A3. 2. No other intracranial large vessel occlusion or significant stenosis. 3. No hemodynamically significant stenosis in the neck. Electronically Signed   By: Wiliam Ke M.D.   On: 11/18/2022 22:36   CT HEAD CODE STROKE WO CONTRAST  Result Date: 11/18/2022 CLINICAL DATA:  Code stroke.  Stroke suspected EXAM: CT HEAD WITHOUT CONTRAST TECHNIQUE: Contiguous axial images were obtained from the base of the skull through the vertex  without intravenous contrast. RADIATION DOSE REDUCTION: This exam was performed according to the departmental dose-optimization program which includes automated exposure control, adjustment of the mA and/or kV according to patient size and/or use of iterative reconstruction technique. COMPARISON:  None Available. FINDINGS: Brain: No evidence of acute infarction, hemorrhage, mass, mass effect, or midline shift. No hydrocephalus or extra-axial collection. Remote infarct in the right cerebellum. Vascular: No hyperdense vessel. Skull: Negative for fracture or focal lesion. Sinuses/Orbits: No acute finding. Other: The mastoid air cells are well aerated. ASPECTS [facility] Stroke Program Early CT Score) - Ganglionic level infarction (caudate, lentiform nuclei, internal capsule, insula, M1-M3 cortex): 7 - Supraganglionic infarction (M4-M6 cortex): 3 Total score (0-10 with 10 being normal): 10 IMPRESSION: No acute intracranial process. ASPECTS is 10. Code stroke imaging results were communicated on 11/18/2022 at 10:27 pm to provider Dr. Derry Lory via telephone, who verbally acknowledged these results. Electronically Signed   By: Wiliam Ke M.D.   On: 11/18/2022 22:27       HISTORY OF PRESENT ILLNESS 57 y.o. patient with history of hypertension and hyperlipidemia was admitted with right-sided weakness, slurred speech and aphasia.  HOSPITAL COURSE Patient was given TNK to treat his stroke.  MRI reveals a left thalamic infarct.  He was evaluated by PT OT SLP and is ready to go home with outpatient therapy.  Stroke:  Left thalamic infarct s/p TNK, etiology:  small vessel disease  Code Stroke CT head No  acute abnormality.  CTA head & neck No LVO CT perfusion No infarct core detected. The area of decreased perfusion is in the right anterior frontal lobe and right occipital lobe, favored to be artifactual.  MRI  Acute infarct in the medial left thalamus.  CT repeat Continued interval evolution of previously  identified left thalamic infarct. No evidence for hemorrhagic transformation or significant regional mass effect. 2D Echo EF 45-50%, LV global hypokinesis with LV hypertrophy LDL 108 HgbA1c 6.5 UDS negative VTE prophylaxis - SCDs No antithrombotic prior to admission, now on ASA 81mg  and Plavix 75mg  for 3 weeks and then ASA 81mg  alone  Therapy recommendations: Outpatient PT, OT, SLP Deposition -  home   Hypertension Home meds:  hydrochlorothiazide Stable Blood Pressure Goal: BP less than 180/105  Long-term BP goal normotensive   Hyperlipidemia Allergy to Atorvastatin  LDL 108, goal < 70 Add crestor 20mg  Continue statin at discharge   Mild cardiomyopathy 2D Echo EF 45-50%, LV global hypokinesis with LV hypertrophy Outpatient follow-up with cardiology   Other Stroke Risk Factors History of stroke on imaging - chronic right cerebellar small infarct   Other acute issues AKI versus CKD 3A, creatinine 1.3-1.47  RN Pressure Injury Documentation:     DISCHARGE EXAM  PHYSICAL EXAM General:  Alert, well-nourished, well-developed patient in no acute distress Psych:  Mood and affect appropriate for situation CV: Regular rate and rhythm on monitor Respiratory:  Regular, unlabored respirations on room air GI: Abdomen soft and nontender  NEURO:  Mental Status: AA&Ox3  Speech/Language: speech is without dysarthria or aphasia, although some word finding difficulties persist.  Naming, repetition, fluency, and comprehension intact.  Cranial Nerves:  II: PERRL.  III, IV, VI: EOMI. Eyelids elevate symmetrically.  V: Sensation is intact to light touch and symmetrical to face.  VII: Smile is symmetrical.  VIII: hearing intact to voice. IX, X: Phonation is normal.  JX:[id number] shrug 5/5. XII: tongue is midline without fasciculations. Motor: 5/5 strength to all muscle groups tested.  Tone: is normal and bulk is normal Sensation- Intact to light touch bilaterally.  Coordination: FTN  intact bilaterally.No drift.  Gait- deferred   Discharge Diet       Diet   Diet Heart Room service appropriate? Yes with Assist; Fluid consistency: Thin   liquids  DISCHARGE PLAN Disposition: Home aspirin 81 mg daily and clopidogrel 75 mg daily for secondary stroke prevention for 3 weeks then aspirin 81 mg daily alone. Ongoing stroke risk factor control by Primary Care Physician at time of discharge Follow-up PCP Ileana Ladd, MD (Inactive) in 2 weeks. Follow-up in [facility] in 8 weeks, office to schedule an appointment.  35 minutes were spent preparing discharge.  Cortney E Ernestina Columbia , MSN, AGACNP-BC [facility] See Amion for schedule and pager information 11/21/2022 1:06 PM  ATTENDING NOTE: I reviewed above note and agree with the assessment and plan. Pt was seen and examined.   No acute event overnight. Neuro stable and improving. PT and OT recommend outpt therapy. Will continue DAPT and statin. Outpt follow up with cardiology.   For detailed assessment and plan, please refer to above/below as I have made changes wherever appropriate.   Neurology will sign off. Please call with questions. Pt will follow up with stroke clinic NP at [facility] in about 4 weeks. Thanks for the consult.   Marvel Plan, MD PhD Stroke Neurology 11/21/2022 10:08 PM

## 2022-11-21 NOTE — Progress Notes (Signed)
Occupational Therapy Treatment Patient Details Name: Gabriel Barnett MRN: [id number] DOB: 17-Aug-1965 Today's Date: 11/21/2022   History of present illness Patient is a 57 yo male presenting to the ED with slurred speech, R sided facial droop and arm weakness on 11/18/22. 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 TNK administered same day.   OT comments  Patient making good progress with OT treatment with supervision for LB dressing and toilet transfers. Patient performed scanning tasks and path finding in hallway with cues to attend to tasks 25% of time. Patient performed quick response tasks while standing at mirror with number/letter matching and card sorting while seated. Patient is expected to discharge home with family and to be followed by neuro outpatient following discharge.       If plan is discharge home, recommend the following:  A little help with walking and/or transfers;Direct supervision/assist for financial management;Direct supervision/assist for medications management;Assist for transportation;Supervision due to cognitive status;A little help with bathing/dressing/bathroom   Equipment Recommendations  None recommended by OT    Recommendations for Other Services      Precautions / Restrictions Precautions Precautions: Fall Precaution Comments: Systolic BP no higher than 180 Restrictions Weight Bearing Restrictions: No       Mobility Bed Mobility Overal bed mobility: Modified Independent             General bed mobility comments: supine upon entry and able to get to EOB without assistance and left with patient seated on EOB    Transfers Overall transfer level: Needs assistance Equipment used: None Transfers: Sit to/from Stand Sit to Stand: Supervision           General transfer comment: supervision for safety     Balance Overall balance assessment: Needs assistance Sitting-balance support: No upper extremity supported, Single extremity  supported, Feet supported Sitting balance-Leahy Scale: Good     Standing balance support: No upper extremity supported, During functional activity Standing balance-Leahy Scale: Good                             ADL either performed or assessed with clinical judgement   ADL Overall ADL's : Needs assistance/impaired                     Lower Body Dressing: Supervision/safety;Sit to/from stand Lower Body Dressing Details (indicate cue type and reason): donned pants with supervision for safety Toilet Transfer: Supervision/safety;Ambulation             General ADL Comments: supervision for self care tasks for safety    Extremity/Trunk Assessment              Vision       Perception     Praxis      Cognition Arousal: Alert Behavior During Therapy: Impulsive, WFL for tasks assessed/performed Overall Cognitive Status: Impaired/Different from baseline Area of Impairment: Memory, Problem solving, Safety/judgement                   Current Attention Level: Sustained Memory: Decreased short-term memory, Decreased recall of precautions Following Commands: Follows multi-step commands inconsistently, Follows one step commands consistently Safety/Judgement: Decreased awareness of safety, Decreased awareness of deficits Awareness: Emergent Problem Solving: Requires verbal cues General Comments: Cognitive tasks performed to address quick responses to commands with ~85% accuracy        Exercises      Shoulder Instructions       General Comments path  finding tasks performed in hallway with visial scanning performed to right and left. Quick response task performed with playing cards and number/letter matching on mirror.    Pertinent Vitals/ Pain       Pain Assessment Pain Assessment: No/denies pain  Home Living                                          Prior Functioning/Environment              Frequency  Min 1X/week         Progress Toward Goals  OT Goals(current goals can now be found in the care plan section)  Progress towards OT goals: Progressing toward goals  Acute Rehab OT Goals Patient Stated Goal: go home OT Goal Formulation: With patient/family Time For Goal Achievement: 12/03/22 Potential to Achieve Goals: Good ADL Goals Pt Will Perform Lower Body Bathing: Independently;sitting/lateral leans;sit to/from stand Pt Will Perform Lower Body Dressing: Independently;sitting/lateral leans;sit to/from stand Pt Will Transfer to Toilet: Independently;ambulating;regular height toilet Pt Will Perform Toileting - Clothing Manipulation and hygiene: Independently;sitting/lateral leans;sit to/from stand Additional ADL Goal #1: Patient will complete pillbox assessment or medication management assessment without errors in order to return to prior level of function. Additional ADL Goal #2: Patient will complete 3-4 step trail making task without cues to recall or correct in order to increase overall upper level cognition.  Plan      Co-evaluation                 AM-PAC OT "6 Clicks" Daily Activity     Outcome Measure   Help from another person eating meals?: A Little Help from another person taking care of personal grooming?: A Little Help from another person toileting, which includes using toliet, bedpan, or urinal?: A Little Help from another person bathing (including washing, rinsing, drying)?: A Little Help from another person to put on and taking off regular upper body clothing?: A Little Help from another person to put on and taking off regular lower body clothing?: A Little 6 Click Score: 18    End of Session    OT Visit Diagnosis: Unsteadiness on feet (R26.81);Other abnormalities of gait and mobility (R26.89);Other symptoms and signs involving cognitive function   Activity Tolerance Patient tolerated treatment well   Patient Left in bed;with call bell/phone within reach;with  family/visitor present (seated on EOB)   Nurse Communication Mobility status        Time: 7846-9629 OT Time Calculation (min): 27 min  Charges: OT General Charges $OT Visit: 1 Visit OT Treatments $Self Care/Home Management : 8-22 mins $Therapeutic Activity: 8-22 mins  Alfonse Flavors, OTA Acute Rehabilitation Services  Office [phone]   Dewain Penning 11/21/2022, 2:09 PM

## 2022-11-21 NOTE — Progress Notes (Signed)
Physical Therapy Treatment Patient Details Name: Gabriel Barnett MRN: [id number] DOB: 04/28/1965 Today's Date: 11/21/2022   History of Present Illness Patient is a 57 yo male presenting to the ED with slurred speech, R sided facial droop and arm weakness on 11/18/22. 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 TNK administered same day.    PT Comments  Pt greeted up in room and agreeable to session with continued progress towards acute goals. Pt demonstrating transfers and gait at grossly supervision level without AD support. Pt with noted R drift and bias in hall, bumping objects and needing increased cues to look and attend to L environment. Pt able to accept balance challenges during gait with minimal LOB with pt able to correct in any and all instances. Current plan remains appropriate to address deficits and maximize functional independence and safety. Pt continues to benefit from skilled PT services to progress toward functional mobility goals.     If plan is discharge home, recommend the following: Assistance with cooking/housework;Assist for transportation   Can travel by private vehicle        Equipment Recommendations  None recommended by PT    Recommendations for Other Services       Precautions / Restrictions Precautions Precautions: Fall Precaution Comments: Systolic BP no higher than 180 Restrictions Weight Bearing Restrictions: No     Mobility  Bed Mobility Overal bed mobility: Modified Independent             General bed mobility comments: up OOB on arrival    Transfers Overall transfer level: Needs assistance Equipment used: None Transfers: Sit to/from Stand Sit to Stand: Supervision           General transfer comment: supervision for safety    Ambulation/Gait Ambulation/Gait assistance: Supervision Gait Distance (Feet): 500 Feet Assistive device: None Gait Pattern/deviations: Step-through pattern Gait velocity: WFL     General Gait  Details: pt with R bias in hall, bumping objects/chairs able to correct any and all LOB, cues to attend to L environemtn   Stairs             Wheelchair Mobility     Tilt Bed    Modified Rankin (Stroke Patients Only) Modified Rankin (Stroke Patients Only) Pre-Morbid Rankin Score: No symptoms Modified Rankin: Moderate disability     Balance Overall balance assessment: Needs assistance Sitting-balance support: No upper extremity supported, Single extremity supported, Feet supported Sitting balance-Leahy Scale: Good     Standing balance support: No upper extremity supported, During functional activity Standing balance-Leahy Scale: Good                   Standardized Balance Assessment Standardized Balance Assessment : Dynamic Gait Index   Dynamic Gait Index Level Surface: Normal Change in Gait Speed: Normal Gait with Horizontal Head Turns: Normal Gait with Vertical Head Turns: Normal Gait and Pivot Turn: Normal Step Over Obstacle: Mild Impairment Step Around Obstacles: Mild Impairment Steps: Mild Impairment Total Score: 21      Cognition Arousal: Alert Behavior During Therapy: Impulsive, WFL for tasks assessed/performed Overall Cognitive Status: Impaired/Different from baseline Area of Impairment: Memory, Problem solving, Safety/judgement                   Current Attention Level: Sustained Memory: Decreased short-term memory, Decreased recall of precautions Following Commands: Follows multi-step commands inconsistently, Follows one step commands consistently Safety/Judgement: Decreased awareness of safety, Decreased awareness of deficits Awareness: Emergent Problem Solving: Requires verbal cues  Exercises      General Comments General comments (skin integrity, edema, etc.): path finding tasks performed in hallway with visial scanning performed to right and left. Quick response task performed with playing cards and number/letter  matching on mirror.      Pertinent Vitals/Pain Pain Assessment Pain Assessment: No/denies pain    Home Living                          Prior Function            PT Goals (current goals can now be found in the care plan section) Acute Rehab PT Goals PT Goal Formulation: With patient Time For Goal Achievement: 11/26/22 Progress towards PT goals: Progressing toward goals    Frequency    Min 1X/week      PT Plan      Co-evaluation              AM-PAC PT "6 Clicks" Mobility   Outcome Measure  Help needed turning from your back to your side while in a flat bed without using bedrails?: A Little Help needed moving from lying on your back to sitting on the side of a flat bed without using bedrails?: A Little Help needed moving to and from a bed to a chair (including a wheelchair)?: A Little Help needed standing up from a chair using your arms (e.g., wheelchair or bedside chair)?: A Little Help needed to walk in hospital room?: A Little Help needed climbing 3-5 steps with a railing? : A Little 6 Click Score: 18    End of Session Equipment Utilized During Treatment: Gait belt Activity Tolerance: Patient tolerated treatment well Patient left: in bed;with call bell/phone within reach;with family/visitor present Nurse Communication: Mobility status PT Visit Diagnosis: Unsteadiness on feet (R26.81);Other symptoms and signs involving the nervous system (R29.898)     Time: 2440-1027 PT Time Calculation (min) (ACUTE ONLY): 27 min  Charges:    $Gait Training: 8-22 mins $Neuromuscular Re-education: 8-22 mins PT General Charges $$ ACUTE PT VISIT: 1 Visit                     Lashaunta Sicard R. PTA Acute Rehabilitation Services Office: [phone]   Catalina Antigua 11/21/2022, 2:19 PM

## 2023-02-27 ENCOUNTER — Other Ambulatory Visit: Payer: Self-pay

## 2023-02-27 NOTE — Patient Outreach (Signed)
 First telephone outreach attempt to obtain mRS. No answer. Left message for returned call.  [facility]  Population Health Care Management Assistant  Direct Dial: [phone]  Fax: [phone] Website: [email or web address]

## 2023-03-05 ENCOUNTER — Telehealth: Payer: Self-pay

## 2023-03-05 NOTE — Patient Outreach (Signed)
 Telephone outreach to patient to obtain mRS was successfully completed. MRS= 1  Vanice Sarah [facility] Health Care Management Assistant  Direct Dial: [phone]  Fax: [phone] Website: [email or web address]

## 2023-06-04 IMAGING — DX DG FINGER MIDDLE 2+V*R*
3 series · 3 of 3 positions shown · non-contrast
Comparison: None

CLINICAL DATA: Hyperextension injury to BILATERAL middle fingers on
02/16/2021 apprehending a prisoner, persistent pain

EXAM:
RIGHT MIDDLE FINGER 2+V

[finger ap]
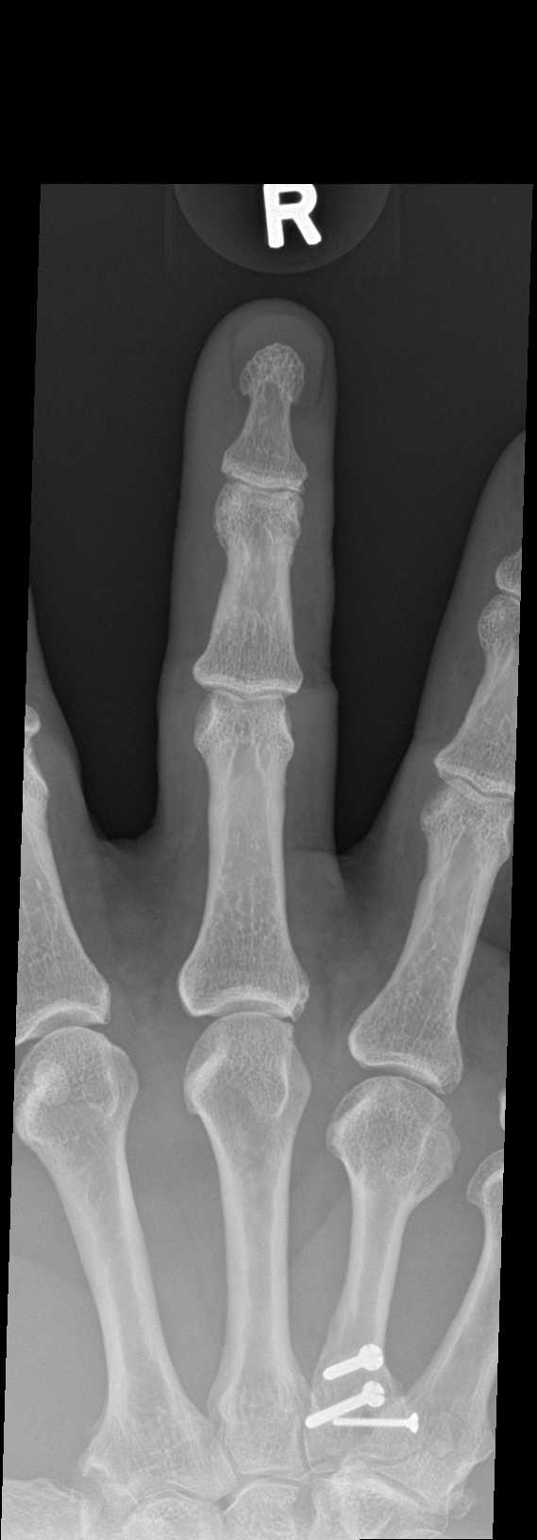

[finger obl]
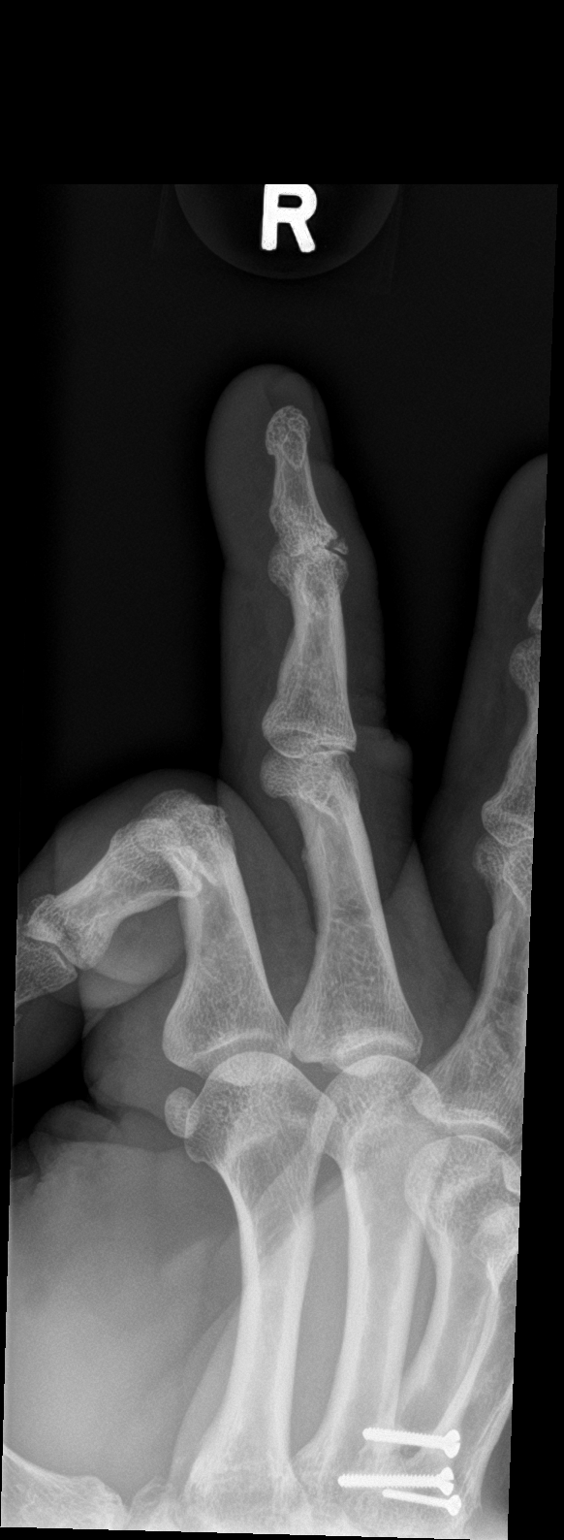

[finger lat]
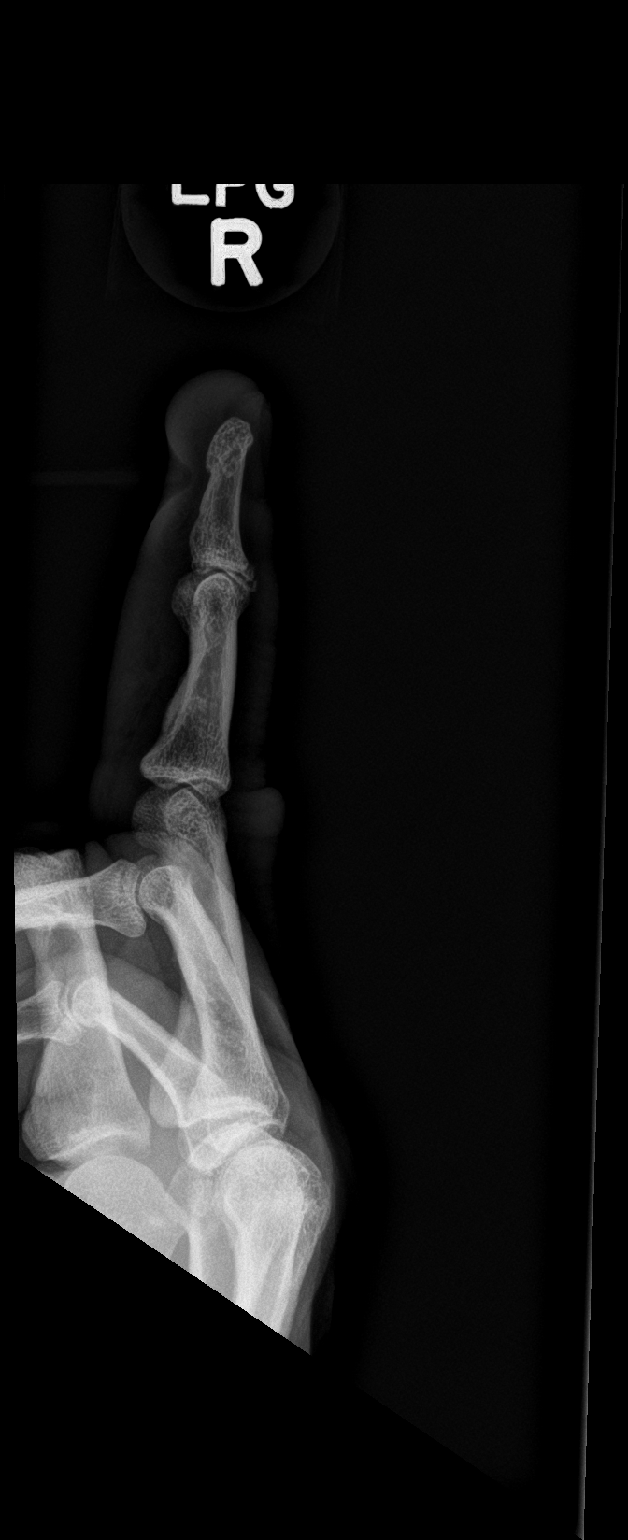

[3 of 3 positions shown; findings below may reference images not displayed]

FINDINGS: Osseous mineralization normal.

Minimal degenerative changes at DIP joint.

Remaining joint spaces preserved.

Small dorsal plate avulsion fracture identified at base of distal
phalanx RIGHT middle finger.

No additional fracture, dislocation, or bone destruction.
IMPRESSION: Dorsal plate avulsion fracture at base of distal phalanx RIGHT
middle finger.
